# Patient Record
Sex: Female | Born: 1960 | Race: Black or African American | Hispanic: No | Marital: Married | Smoking: Former smoker
Health system: Southern US, Community
[De-identification: ages and names within clinical notes are randomized; demographics above are authoritative.]

## PROBLEM LIST (undated history)

## (undated) DIAGNOSIS — B009 Herpesviral infection, unspecified: Secondary | ICD-10-CM

## (undated) DIAGNOSIS — I10 Essential (primary) hypertension: Secondary | ICD-10-CM

## (undated) DIAGNOSIS — Z87898 Personal history of other specified conditions: Secondary | ICD-10-CM

## (undated) HISTORY — PX: NO PAST SURGERIES: SHX2092

## (undated) HISTORY — DX: Personal history of other specified conditions: Z87.898

---

## 1998-01-01 DIAGNOSIS — F1491 Cocaine use, unspecified, in remission: Secondary | ICD-10-CM

## 1998-01-01 DIAGNOSIS — Z87898 Personal history of other specified conditions: Secondary | ICD-10-CM

## 1998-01-01 HISTORY — DX: Personal history of other specified conditions: Z87.898

## 1998-01-01 HISTORY — DX: Cocaine use, unspecified, in remission: F14.91

## 1998-01-17 ENCOUNTER — Inpatient Hospital Stay (HOSPITAL_COMMUNITY): Admission: RE | Admit: 1998-01-17 | Discharge: 1998-01-17 | Payer: Self-pay | Admitting: Obstetrics & Gynecology

## 1998-01-19 ENCOUNTER — Inpatient Hospital Stay (HOSPITAL_COMMUNITY): Admission: AD | Admit: 1998-01-19 | Discharge: 1998-01-19 | Payer: Self-pay | Admitting: Obstetrics

## 1998-01-24 ENCOUNTER — Encounter (HOSPITAL_COMMUNITY): Admission: RE | Admit: 1998-01-24 | Discharge: 1998-02-16 | Payer: Self-pay | Admitting: Obstetrics & Gynecology

## 1998-01-27 ENCOUNTER — Inpatient Hospital Stay (HOSPITAL_COMMUNITY): Admission: AD | Admit: 1998-01-27 | Discharge: 1998-02-17 | Payer: Self-pay | Admitting: Obstetrics & Gynecology

## 1998-01-31 ENCOUNTER — Encounter: Payer: Self-pay | Admitting: Obstetrics & Gynecology

## 1998-02-14 ENCOUNTER — Encounter: Payer: Self-pay | Admitting: Obstetrics & Gynecology

## 1998-08-22 ENCOUNTER — Encounter: Admission: RE | Admit: 1998-08-22 | Discharge: 1998-08-22 | Payer: Self-pay | Admitting: Family Medicine

## 1998-09-06 ENCOUNTER — Encounter: Admission: RE | Admit: 1998-09-06 | Discharge: 1998-09-06 | Payer: Self-pay | Admitting: Family Medicine

## 1999-01-03 ENCOUNTER — Encounter: Admission: RE | Admit: 1999-01-03 | Discharge: 1999-01-03 | Payer: Self-pay | Admitting: Family Medicine

## 1999-02-06 ENCOUNTER — Encounter: Admission: RE | Admit: 1999-02-06 | Discharge: 1999-02-06 | Payer: Self-pay | Admitting: Family Medicine

## 1999-02-21 ENCOUNTER — Encounter: Admission: RE | Admit: 1999-02-21 | Discharge: 1999-02-21 | Payer: Self-pay | Admitting: Sports Medicine

## 1999-04-06 ENCOUNTER — Encounter: Admission: RE | Admit: 1999-04-06 | Discharge: 1999-04-06 | Payer: Self-pay | Admitting: Family Medicine

## 1999-08-22 ENCOUNTER — Other Ambulatory Visit: Admission: RE | Admit: 1999-08-22 | Discharge: 1999-08-22 | Payer: Self-pay | Admitting: Sports Medicine

## 1999-08-22 ENCOUNTER — Encounter: Admission: RE | Admit: 1999-08-22 | Discharge: 1999-08-22 | Payer: Self-pay | Admitting: Sports Medicine

## 2000-01-11 ENCOUNTER — Encounter: Admission: RE | Admit: 2000-01-11 | Discharge: 2000-01-11 | Payer: Self-pay | Admitting: Family Medicine

## 2000-02-21 ENCOUNTER — Emergency Department (HOSPITAL_COMMUNITY): Admission: EM | Admit: 2000-02-21 | Discharge: 2000-02-21 | Payer: Self-pay | Admitting: Emergency Medicine

## 2000-03-26 ENCOUNTER — Encounter: Admission: RE | Admit: 2000-03-26 | Discharge: 2000-03-26 | Payer: Self-pay | Admitting: Sports Medicine

## 2000-03-28 ENCOUNTER — Encounter: Admission: RE | Admit: 2000-03-28 | Discharge: 2000-03-28 | Payer: Self-pay | Admitting: Family Medicine

## 2000-04-02 ENCOUNTER — Encounter: Admission: RE | Admit: 2000-04-02 | Discharge: 2000-04-02 | Payer: Self-pay | Admitting: Family Medicine

## 2000-04-26 ENCOUNTER — Encounter: Admission: RE | Admit: 2000-04-26 | Discharge: 2000-04-26 | Payer: Self-pay | Admitting: Family Medicine

## 2000-05-21 ENCOUNTER — Encounter: Admission: RE | Admit: 2000-05-21 | Discharge: 2000-05-21 | Payer: Self-pay | Admitting: Family Medicine

## 2000-09-11 ENCOUNTER — Encounter: Admission: RE | Admit: 2000-09-11 | Discharge: 2000-09-11 | Payer: Self-pay | Admitting: Family Medicine

## 2000-09-11 ENCOUNTER — Other Ambulatory Visit: Admission: RE | Admit: 2000-09-11 | Discharge: 2000-09-11 | Payer: Self-pay | Admitting: Obstetrics

## 2000-11-15 ENCOUNTER — Emergency Department (HOSPITAL_COMMUNITY): Admission: EM | Admit: 2000-11-15 | Discharge: 2000-11-15 | Payer: Self-pay | Admitting: Emergency Medicine

## 2001-09-03 ENCOUNTER — Encounter: Admission: RE | Admit: 2001-09-03 | Discharge: 2001-09-03 | Payer: Self-pay | Admitting: Family Medicine

## 2001-09-24 ENCOUNTER — Encounter: Admission: RE | Admit: 2001-09-24 | Discharge: 2001-09-24 | Payer: Self-pay | Admitting: Family Medicine

## 2001-09-29 ENCOUNTER — Encounter: Admission: RE | Admit: 2001-09-29 | Discharge: 2001-09-29 | Payer: Self-pay | Admitting: Family Medicine

## 2001-10-07 ENCOUNTER — Encounter: Admission: RE | Admit: 2001-10-07 | Discharge: 2001-10-07 | Payer: Self-pay | Admitting: Sports Medicine

## 2001-11-03 ENCOUNTER — Encounter: Admission: RE | Admit: 2001-11-03 | Discharge: 2001-11-03 | Payer: Self-pay | Admitting: Sports Medicine

## 2002-09-22 ENCOUNTER — Emergency Department (HOSPITAL_COMMUNITY): Admission: EM | Admit: 2002-09-22 | Discharge: 2002-09-22 | Payer: Self-pay | Admitting: Emergency Medicine

## 2002-09-28 ENCOUNTER — Encounter: Admission: RE | Admit: 2002-09-28 | Discharge: 2002-09-28 | Payer: Self-pay | Admitting: Family Medicine

## 2002-10-21 ENCOUNTER — Encounter: Admission: RE | Admit: 2002-10-21 | Discharge: 2002-10-21 | Payer: Self-pay | Admitting: Family Medicine

## 2002-10-21 ENCOUNTER — Encounter (INDEPENDENT_AMBULATORY_CARE_PROVIDER_SITE_OTHER): Payer: Self-pay | Admitting: *Deleted

## 2002-11-30 ENCOUNTER — Encounter: Admission: RE | Admit: 2002-11-30 | Discharge: 2002-11-30 | Payer: Self-pay | Admitting: Sports Medicine

## 2003-02-23 ENCOUNTER — Encounter: Admission: RE | Admit: 2003-02-23 | Discharge: 2003-02-23 | Payer: Self-pay | Admitting: Family Medicine

## 2004-01-05 ENCOUNTER — Ambulatory Visit: Payer: Self-pay | Admitting: Family Medicine

## 2004-01-24 ENCOUNTER — Encounter: Admission: RE | Admit: 2004-01-24 | Discharge: 2004-01-24 | Payer: Self-pay | Admitting: Sports Medicine

## 2005-01-01 ENCOUNTER — Encounter (INDEPENDENT_AMBULATORY_CARE_PROVIDER_SITE_OTHER): Payer: Self-pay | Admitting: *Deleted

## 2005-01-10 ENCOUNTER — Ambulatory Visit: Payer: Self-pay | Admitting: Family Medicine

## 2005-01-30 ENCOUNTER — Encounter: Admission: RE | Admit: 2005-01-30 | Discharge: 2005-01-30 | Payer: Self-pay | Admitting: Sports Medicine

## 2005-08-27 ENCOUNTER — Ambulatory Visit: Payer: Self-pay | Admitting: Family Medicine

## 2005-09-13 ENCOUNTER — Ambulatory Visit: Payer: Self-pay | Admitting: Family Medicine

## 2005-10-11 ENCOUNTER — Ambulatory Visit: Payer: Self-pay | Admitting: Family Medicine

## 2005-10-29 ENCOUNTER — Ambulatory Visit: Payer: Self-pay | Admitting: Family Medicine

## 2005-11-28 ENCOUNTER — Ambulatory Visit: Payer: Self-pay | Admitting: Family Medicine

## 2005-12-28 ENCOUNTER — Ambulatory Visit: Payer: Self-pay | Admitting: Family Medicine

## 2006-01-08 ENCOUNTER — Encounter (INDEPENDENT_AMBULATORY_CARE_PROVIDER_SITE_OTHER): Payer: Self-pay | Admitting: Family Medicine

## 2006-01-08 ENCOUNTER — Ambulatory Visit: Payer: Self-pay | Admitting: Family Medicine

## 2006-01-08 LAB — CONVERTED CEMR LAB
CO2: 22 meq/L (ref 19–32)
Calcium: 9.1 mg/dL (ref 8.4–10.5)
Potassium: 4.1 meq/L (ref 3.5–5.3)
Sodium: 141 meq/L (ref 135–145)

## 2006-02-27 ENCOUNTER — Encounter: Admission: RE | Admit: 2006-02-27 | Discharge: 2006-02-27 | Payer: Self-pay | Admitting: Vascular Surgery

## 2006-02-28 DIAGNOSIS — I10 Essential (primary) hypertension: Secondary | ICD-10-CM | POA: Insufficient documentation

## 2006-02-28 DIAGNOSIS — G4700 Insomnia, unspecified: Secondary | ICD-10-CM | POA: Insufficient documentation

## 2006-03-01 ENCOUNTER — Encounter (INDEPENDENT_AMBULATORY_CARE_PROVIDER_SITE_OTHER): Payer: Self-pay | Admitting: *Deleted

## 2006-03-20 ENCOUNTER — Ambulatory Visit: Payer: Self-pay | Admitting: Sports Medicine

## 2007-02-24 ENCOUNTER — Telehealth (INDEPENDENT_AMBULATORY_CARE_PROVIDER_SITE_OTHER): Payer: Self-pay | Admitting: Family Medicine

## 2007-02-24 ENCOUNTER — Telehealth: Payer: Self-pay | Admitting: *Deleted

## 2007-02-25 ENCOUNTER — Ambulatory Visit: Payer: Self-pay | Admitting: Family Medicine

## 2007-03-04 ENCOUNTER — Encounter (INDEPENDENT_AMBULATORY_CARE_PROVIDER_SITE_OTHER): Payer: Self-pay | Admitting: Family Medicine

## 2007-03-04 ENCOUNTER — Ambulatory Visit: Payer: Self-pay | Admitting: Family Medicine

## 2007-03-04 LAB — CONVERTED CEMR LAB: Whiff Test: NEGATIVE

## 2007-03-09 ENCOUNTER — Encounter (INDEPENDENT_AMBULATORY_CARE_PROVIDER_SITE_OTHER): Payer: Self-pay | Admitting: *Deleted

## 2007-03-10 ENCOUNTER — Ambulatory Visit: Payer: Self-pay | Admitting: Sports Medicine

## 2007-03-10 ENCOUNTER — Encounter (INDEPENDENT_AMBULATORY_CARE_PROVIDER_SITE_OTHER): Payer: Self-pay | Admitting: Family Medicine

## 2007-03-12 LAB — CONVERTED CEMR LAB
Cholesterol: 146 mg/dL (ref 0–200)
HDL: 60 mg/dL (ref >39)

## 2007-09-10 ENCOUNTER — Ambulatory Visit: Payer: Self-pay | Admitting: Family Medicine

## 2007-09-10 LAB — CONVERTED CEMR LAB: Hemoglobin: 12.9 g/dL

## 2007-10-22 ENCOUNTER — Encounter: Admission: RE | Admit: 2007-10-22 | Discharge: 2007-10-22 | Payer: Self-pay | Admitting: Family Medicine

## 2009-08-08 ENCOUNTER — Ambulatory Visit: Payer: Self-pay | Admitting: Family Medicine

## 2009-08-08 ENCOUNTER — Encounter: Payer: Self-pay | Admitting: Family Medicine

## 2009-08-08 LAB — CONVERTED CEMR LAB
BUN: 11 mg/dL (ref 6–23)
CO2: 25 meq/L (ref 19–32)
Calcium: 9.2 mg/dL (ref 8.4–10.5)
Creatinine, Ser: 0.78 mg/dL (ref 0.40–1.20)
Glucose, Bld: 98 mg/dL (ref 70–99)

## 2009-08-22 ENCOUNTER — Ambulatory Visit: Payer: Self-pay | Admitting: Family Medicine

## 2009-09-15 ENCOUNTER — Ambulatory Visit: Payer: Self-pay | Admitting: Family Medicine

## 2009-10-06 ENCOUNTER — Ambulatory Visit: Payer: Self-pay | Admitting: Family Medicine

## 2009-10-06 ENCOUNTER — Encounter: Payer: Self-pay | Admitting: Family Medicine

## 2009-10-06 DIAGNOSIS — M79609 Pain in unspecified limb: Secondary | ICD-10-CM | POA: Insufficient documentation

## 2009-10-06 DIAGNOSIS — E669 Obesity, unspecified: Secondary | ICD-10-CM | POA: Insufficient documentation

## 2009-10-06 LAB — CONVERTED CEMR LAB
ALT: 12 units/L (ref 0–35)
Albumin: 4.2 g/dL (ref 3.5–5.2)
CO2: 27 meq/L (ref 19–32)
Chlamydia, DNA Probe: NEGATIVE
Chloride: 102 meq/L (ref 96–112)
Cholesterol: 181 mg/dL (ref 0–200)
GC Probe Amp, Genital: NEGATIVE
HDL: 68 mg/dL (ref >39)
Potassium: 3.6 meq/L (ref 3.5–5.3)
Sodium: 139 meq/L (ref 135–145)
Total Bilirubin: 0.6 mg/dL (ref 0.3–1.2)
Total Protein: 7 g/dL (ref 6.0–8.3)

## 2009-12-25 ENCOUNTER — Emergency Department (HOSPITAL_COMMUNITY)
Admission: EM | Admit: 2009-12-25 | Discharge: 2009-12-25 | Payer: Self-pay | Source: Home / Self Care | Admitting: Emergency Medicine

## 2010-01-22 ENCOUNTER — Encounter: Payer: Self-pay | Admitting: Family Medicine

## 2010-01-24 ENCOUNTER — Encounter
Admission: RE | Admit: 2010-01-24 | Discharge: 2010-01-24 | Payer: Self-pay | Source: Home / Self Care | Attending: Family Medicine | Admitting: Family Medicine

## 2010-01-31 NOTE — Assessment & Plan Note (Signed)
Summary: bp check/eo  Nurse Visit  Patient was in office early this AM for BP check. BP checked after patient rested .BP checked manually with regular adult cuff. She  is taking medications as directed. BP bilaterally 140/92. Pulse 64. Weight 191.3 today. States she is exercising as directed. Will forward message to Dr. Janalyn Harder since she was seen by Dr. Janalyn Harder on 08/08/2009.  Marland Kitchen Appointment scheduled with PCP  09/15/2009. Theresia Lo RN  August 22, 2009 4:21 PM   Orders Added: 1)  No Charge Patient Arrived (NCPA0) [NCPA0]  I will not make changes to med at this time.  Pt has been noncompliant.  I will see pt in clinic and decide at that time what should be done.  If BP continues in this thrend, will most likely add ACEi. Cat Ta MD  August 22, 2009 5:56 PM

## 2010-01-31 NOTE — Assessment & Plan Note (Signed)
Summary: F/U MEDS/NEED REFILLS/[facility]   Vital Signs:  Patient profile:   50 year old female Height:      67 inches Weight:      190.5 pounds BMI:     29.94 Temp:     98.3 degrees F Pulse rate:   71 / minute BP sitting:   158 / 88  Vitals Entered By: Golden Circle RN (August 08, 2009 8:33 AM)  Primary Care Provider:  Ellin Mayhew MD  CC:  Med refills.  History of Present Illness: 50 y/o F here for Med refills.   HYPERTENSION Disease Monitoring Blood pressure range: states she does not check her BP until she feels like she has HA, last check 178/70 about a month.  Last refill was on 03/30/09 with 2 refills.  Per pt she takes her meds everyday and ONLY misses on the weekends.  Then admits that she missed now and then.  She has been out of med since last Sunday, 8 days ago.  States that now she will keep her med in her car and takes it in AM on way to work.   Medications: HCTZ 25mg  daily Compliance: NO  Lightheadedness: sometimes, started on Saturday, took Tylenol and was relief Edema:  no  Chest pain: no   Dyspnea: no Prevention Exercise: no  Salt restriction: yes Water consumption: 40 oz water + 40 oz herbal tea   Habits & Providers  Alcohol-Tobacco-Diet     Alcohol drinks/day: 0     Tobacco Status: quit  Exercise-Depression-Behavior     Seat Belt Use: always     Sun Exposure: rarely  Past History:  Past Medical History: -H/o preeclampsia 2000 -Hx cocaine use, smoke (quit 10 yrs ago) -HYPERTENSION, BENIGN SYSTEMIC (ICD-401.1)  **Medical non-compliance  Social History: Sun Exposure-Excessive:  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   Neck:  No bruit Lungs:  Normal respiratory effort, chest expands symmetrically. Lungs are clear to  auscultation, no crackles or wheezes. Heart:  Normal rate and regular rhythm. S1 and S2 normal without gallop, murmur, click, rub or other extra sounds. Pulses:  R radial normal, R dorsalis pedis normal, L radial normal, and L dorsalis pedis normal.   Extremities:  No clubbing, cyanosis, edema, or deformity noted with normal full range of motion of all joints.   Neurologic:  alert & oriented X3 and cranial nerves II-XII intact.     Impression & Recommendations:  Problem # 1:  HYPERTENSION, BENIGN SYSTEMIC (ICD-401.1) Assessment Deteriorated Pt noncompliant.  Takes med whenever she remembers.  She states that she takes med daily until last Sunday.  When confronted with the impossibility of this due to med refills, pt admits that she misses dose.  Will resume previous hctz 25 for now.  Will check bmet as last check was in 2009.  Pt to rtc in 2 wks to see nurse for BP check.  Pt to rtc in 4-5 wks for BP f/u. We spent a lot of time discussing the importance of taking her meds and to have f/u visits to discuss med changes.  Pt does not have insurance so that may be barrier to getting her here for visit.  Told her about [facility].    Her updated medication list for this problem includes:  Hydrochlorothiazide 25 Mg Tabs (Hydrochlorothiazide) .Marland Kitchen... 1 tablet by mouth once a day  Orders: T-Basic Metabolic Panel [id number]) [facility]- Est Level  3 (24401)  Problem # 2:  Screening Cervical Cancer (ICD-V76.2) Assessment: Comment Only Discussed that she is due.   Problem # 3:  Screening Breast Cancer (ICD-V76.10) Assessment: Comment Only Gave pt order to get MMG.    Complete Medication List: 1)  Hydrochlorothiazide 25 Mg Tabs (Hydrochlorothiazide) .Marland Kitchen.. 1 tablet by mouth once a day 2)  Sumatriptan Succinate 25 Mg Tabs (Sumatriptan succinate) .Marland Kitchen.. 1 tab by mouth for migraine.  may repeat in 2 hours  Other Orders: Mammogram (Screening) (Mammo)  Patient Instructions: 1)  Please schedule a follow-up  appointment in 1 month with your primary doctor for blood pressure.  2)  Please schedule a follow-up appointment in 2 weeks for Nurse visit to check blood pressure.  3)  It is important that you exercise reguarly at least 20 minutes 5 times a week. If you develop chest pain, have severe difficulty breathing, or feel very tired, stop exercising immediately and seek medical attention.  4)  You need to lose weight. Consider a lower calorie diet and regular exercise.  5)  Schedule your mammogram.  Prescriptions: HYDROCHLOROTHIAZIDE 25 MG TABS (HYDROCHLOROTHIAZIDE) 1 tablet by mouth once a day  #31 x 0   Entered and Authorized by:   Angeline Slim MD   Signed by:   Angeline Slim MD on 08/08/2009   Method used:   Electronically to        [facility]* (retail)       [address]       [place], [place]  [id number]       Ph: [phone]       Fax: [phone]   RxID:   [id number] HYDROCHLOROTHIAZIDE 25 MG TABS (HYDROCHLOROTHIAZIDE) 1 tablet by mouth once a day  #31 x 0   Entered and Authorized by:   Angeline Slim MD   Signed by:   Angeline Slim MD on 08/08/2009   Method used:   [facility] [address] [phone]* (retail)       [address]       [place], [place]  [address]       Ph: [phone] or [phone]       Fax: [phone]   RxID:   [id number]    Prevention & Chronic Care Immunizations   Influenza vaccine: declined  (03/04/2007)   Influenza vaccine due: 03/03/2008    Tetanus booster: 08/01/2005: Done.   Tetanus booster due: 08/02/2015    Pneumococcal vaccine: Not documented  Other Screening   Pap smear: normal  (03/04/2007)   Pap smear due: 03/2009    Mammogram: normal  (10/22/2007)   Mammogram action/deferral: Ordered  (08/08/2009)   Mammogram due: 10/21/2008   Smoking status: quit  (08/08/2009)  Lipids   Total Cholesterol: 146  (03/10/2007)   LDL: 78  (03/10/2007)   LDL Direct: Not documented   HDL: 60  (03/10/2007)    Triglycerides: 40  (03/10/2007)  Hypertension   Last Blood Pressure: 158 / 88  (08/08/2009)   Serum creatinine: 0.85  (01/08/2006)   BMP action: Ordered   Serum potassium 4.1  (01/08/2006)    Hypertension flowsheet reviewed?: Yes   Progress toward BP goal: Deteriorated  Self-Management Support :   Personal Goals (by the next clinic visit) :  Personal blood pressure goal: 130/80  (08/08/2009)   Hypertension self-management support: Not documented   Nursing Instructions: Schedule screening mammogram (see order)

## 2010-01-31 NOTE — Letter (Signed)
Summary: Work Excuse  [facility]  [address]   [place], [place] [address]   Phone: [phone]  Fax: [phone]    Today's Date: August 08, 2009  Name of Patient: Wendy Mann [facility]  The above named patient had a medical visit today at:  8:45 am   Please take this into consideration when reviewing the time away from work/school.    Special Instructions:  [ x ] None  [  ] To be off the remainder of today, returning to the normal work / school schedule tomorrow.  [  ] To be off until the next scheduled appointment on ______________________.  [  ] Other ________________________________________________________________ ________________________________________________________________________   Sincerely yours,   Cat Ta MD

## 2010-01-31 NOTE — Assessment & Plan Note (Signed)
Summary: CPE   Vital Signs:  Patient profile:   50 year old female Height:      66 inches Weight:      188.44 pounds BMI:     30.52 BSA:     1.95 Temp:     98.3 degrees F Pulse rate:   66 / minute BP sitting:   130 / 84  Vitals Entered By: Jone Baseman CMA (October 06, 2009 8:42 AM) CC: f/u Is Patient Diabetic? No Pain Assessment Patient in pain? no        Primary Care Provider:  Ellin Mayhew MD  CC:  f/u.  History of Present Illness: left foot pain: medial left heel pain, x 5 months, off and on, worse with activity, in am 5/10, after work pain is 10/10.  pain is throbbing, sitting down makes it better.  tylenol helps minimally.  has caused her to decrease her exercise.  weight: pt states her goal weight is 175 lbs.  less exercise b/c of heel pain.  Still exercising 2 x week for 45 min.  Good diet--approx 4 servings of vegtables.      Habits & Providers  Alcohol-Tobacco-Diet     Alcohol drinks/day: 0     Tobacco Status: quit  Current Medications (verified): 1)  Hydrochlorothiazide 25 Mg Tabs (Hydrochlorothiazide) .Marland Kitchen.. 1 Tablet By Mouth Once A Day 2)  Sumatriptan Succinate 25 Mg Tabs (Sumatriptan Succinate) .Marland Kitchen.. 1 Tab By Mouth For Migraine.  May Repeat in 2 Hours  Allergies (verified): No Known Drug Allergies  Review of Systems       No weight loss, no fever, no urinary or bowel problems, no blood in stool, no dark stools  Physical Exam  General:  VSS Well-developed,well-nourished,in no acute distress; alert,appropriate and cooperative throughout examination Lungs:  Normal respiratory effort, chest expands symmetrically. Lungs are clear to auscultation, no crackles or wheezes. Heart:  Normal rate and regular rhythm. S1 and S2 normal without gallop, murmur, click, rub or other extra sounds. Genitalia:  Pelvic Exam:        External: normal female genitalia without lesions or masses        Vagina: normal without lesions or masses        Cervix: normal  without lesions or masses        Adnexa: normal bimanual exam without masses or fullness        Uterus: normal by palpation        Pap smear: performed Msk:  on left medial heel small callous present.  point tenderness at site of callous Pulses:  2+ Extremities:  no edema Neurologic:  No cranial nerve deficits noted. Station and gait are normal. Plantar reflexes are down-going bilaterally. DTRs are symmetrical throughout. Sensory, motor and coordinative functions appear intact. Skin:  Intact without suspicious lesions or rashes Psych:  Cognition and judgment appear intact. Alert and cooperative with normal attention span and concentration. No apparent delusions, illusions, hallucinations   Impression & Recommendations:  Problem # 1:  HYPERTENSION, BENIGN SYSTEMIC (ICD-401.1) well controlled today.  will continue same regimen.  Pt to return in 6 months for follow up appt.  Her updated medication list for this problem includes:    Hydrochlorothiazide 25 Mg Tabs (Hydrochlorothiazide) .Marland Kitchen... 1 tablet by mouth once a day  Orders: Lipid-[facility] ([id number]) Comp Met-[facility] ([id number]) [facility] - Est  40-64 yrs ([address])  Problem # 2:  SEXUALLY TRANSMITTED DISEASE, EXPOSURE TO (ICD-V01.6) Pt requests that she be retested for HIV since she has a  hx of drug abuse.  Pt states she has no recent known exposure.   Orders: HIV-[facility] ([id number]) RPR-[facility] [id number]) GC/Chlamydia-[facility] (87591/[id number]) [facility] - Est  40-64 yrs ([address]42)  Problem # 3:  Preventive Health Care (ICD-V70.0) Will obtain cholesterol screen and BMET in the setting of overweight and hypertension. Enouraged pt to schedule mammogram.  Orders: [facility] - Est  40-64 yrs ([address])  Problem # 4:  FOOT PAIN, LEFT (ICD-729.5) pain located  at callous.  Most likely 2/2 friction from work shoes.  Pt is going to switch out work shoes to see if this pain improves.  Pt to return if no improvement.   Problem # 5:  OBESITY, UNSPECIFIED (ICD-278.00) Pt is  working out at [employer] 2 x week.  Pt plans to increase frequency of visits to gym.  Pt states that she has a healthy diet.  she will consider meeting with nutritionist for guidance on weight loss.  She will call for an appt if she decides she would like to do this.   Complete Medication List: 1)  Hydrochlorothiazide 25 Mg Tabs (Hydrochlorothiazide) .Marland Kitchen.. 1 tablet by mouth once a day 2)  Sumatriptan Succinate 25 Mg Tabs (Sumatriptan succinate) .Marland Kitchen.. 1 tab by mouth for migraine.  may repeat in 2 hours  Other Orders: Pap Smear-[facility] ([id number])  Patient Instructions: 1)  Go speak with [place] about finanicial assistance. 2)  Schedule mammogram- you are due for this.  3)  Consider meeting with nutritionist to discuss weight loss and nutrition. 4)  continue to work out- try to increase frequency. 5)  return in 6 months for blood pressure recheck or sooner if needed.   Prevention & Chronic Care Immunizations   Influenza vaccine: declined  (03/04/2007)   Influenza vaccine deferral: Refused  (10/06/2009)   Influenza vaccine due: 03/03/2008    Tetanus booster: 08/01/2005: Done.   Tetanus booster due: 08/02/2015    Pneumococcal vaccine: Not documented  Other Screening   Pap smear: normal  (03/04/2007)   Pap smear action/deferral: Ordered  (10/06/2009)   Pap smear due: 03/2009    Mammogram: normal  (10/22/2007)   Mammogram action/deferral: Ordered  (08/08/2009)   Mammogram due: 10/21/2008   Smoking status: quit  (10/06/2009)  Lipids   Total Cholesterol: 146  (03/10/2007)   LDL: 78  (03/10/2007)   LDL Direct: Not documented   HDL: 60  (03/10/2007)   Triglycerides: 40  (03/10/2007)  Hypertension   Last Blood Pressure: 130 / 84  (10/06/2009)   Serum creatinine: 0.78  (08/08/2009)   BMP action: Ordered   Serum potassium 4.3  (08/08/2009) CMP ordered     Hypertension flowsheet reviewed?: Yes   Progress toward BP goal: At goal  Self-Management Support :   Personal Goals  (by the next clinic visit) :      Personal blood pressure goal: 130/80  (08/08/2009)   Hypertension self-management support: Not documented   Nursing Instructions: Pap smear today

## 2010-01-31 NOTE — Assessment & Plan Note (Signed)
Summary: BP follow up   Vital Signs:  Patient profile:   50 year old female Height:      67 inches Weight:      187 pounds BMI:     29.39 Temp:     98.1 degrees F oral Pulse rate:   65 / minute BP sitting:   137 / 86  (right arm) Cuff size:   regular  Vitals Entered By: Tessie Fass CMA (September 15, 2009 8:50 AM) CC: F/U BP, Hypertension Management Is Patient Diabetic? No Pain Assessment Patient in pain? no        Primary Care Provider:  Ellin Mayhew MD  CC:  F/U BP and Hypertension Management.  History of Present Illness: Pt is here for bp recheck.  She states she has been taking bp medication consistently and that she has started going to the gym 3 x week for exercise.  Pt BP 137/86.  HR 65.  Hypertension History:      Positive major cardiovascular risk factors include hypertension.  Negative major cardiovascular risk factors include female age less than 15 years old and non-tobacco-user status.    Habits & Providers  Alcohol-Tobacco-Diet     Tobacco Status: quit  Current Medications (verified): 1)  Hydrochlorothiazide 25 Mg Tabs (Hydrochlorothiazide) .Marland Kitchen.. 1 Tablet By Mouth Once A Day 2)  Sumatriptan Succinate 25 Mg Tabs (Sumatriptan Succinate) .Marland Kitchen.. 1 Tab By Mouth For Migraine.  May Repeat in 2 Hours  Allergies (verified): No Known Drug Allergies  Review of Systems       No CP, No SOB, No edema  Physical Exam  General:  VSS Well-developed,well-nourished,in no acute distress; alert,appropriate and cooperative throughout examination Lungs:  Normal respiratory effort, chest expands symmetrically. Lungs are clear to auscultation, no crackles or wheezes. Heart:  Normal rate and regular rhythm. S1 and S2 normal without gallop, murmur, click, rub or other extra sounds. Extremities:  No edema Psych:  Cognition and judgment appear intact. Alert and cooperative with normal attention span and concentration. No apparent delusions, illusions,  hallucinations   Impression & Recommendations:  Problem # 1:  HYPERTENSION, BENIGN SYSTEMIC (ICD-401.1) Pt BP 137/86 today.  This is WNL.  Pt BP goal is less than 140/90.  Encouraged to continue to exercise and take medication as directed.   Her updated medication list for this problem includes:    Hydrochlorothiazide 25 Mg Tabs (Hydrochlorothiazide) .Marland Kitchen... 1 tablet by mouth once a day  Orders: [facility]- Est Level  3 ([address])  Problem # 2:  Preventive Health Care (ICD-V70.0) Pt to make an appt within the next month or so for Complete physical exam.  At that time we will review the PCMH form together to make sure all screening exams are up to date.  Will also talk about weight management at that visit.   Complete Medication List: 1)  Hydrochlorothiazide 25 Mg Tabs (Hydrochlorothiazide) .Marland Kitchen.. 1 tablet by mouth once a day 2)  Sumatriptan Succinate 25 Mg Tabs (Sumatriptan succinate) .Marland Kitchen.. 1 tab by mouth for migraine.  may repeat in 2 hours  Hypertension Assessment/Plan:      The patient's hypertensive risk group is category A: No risk factors and no target organ damage.  Her calculated 10 year risk of coronary heart disease is 2 %.  Today's blood pressure is 137/86.  Her blood pressure goal is < 140/90.   Patient Instructions: 1)  return within 1 month for a complete physical.  2)  You are doing great!!!  Keep up the good  work. Prescriptions: HYDROCHLOROTHIAZIDE 25 MG TABS (HYDROCHLOROTHIAZIDE) 1 tablet by mouth once a day  #30 x 6   Entered and Authorized by:   Ellin Mayhew MD   Signed by:   Ellin Mayhew MD on 09/15/2009   Method used:   Electronically to        [facility]* (retail)       [address]       [place], [place]  [id number]       Ph: [phone]       Fax: [phone]   RxID:   [id number] HYDROCHLOROTHIAZIDE 25 MG TABS (HYDROCHLOROTHIAZIDE) 1 tablet by mouth once a day  #30 x 6   Entered and Authorized by:   Ellin Mayhew MD   Signed by:   Ellin Mayhew MD on 09/15/2009   Method used:   [facility] [address] [phone][facility] (retail)       [address]       [place], [place]  [address]       Ph: [phone] or [phone]       Fax: [phone]   RxID:   [id number]    Prevention & Chronic Care Immunizations   Influenza vaccine: declined  (03/04/2007)   Influenza vaccine due: 03/03/2008    Tetanus booster: 08/01/2005: Done.   Tetanus booster due: 08/02/2015    Pneumococcal vaccine: Not documented  Other Screening   Pap smear: normal  (03/04/2007)   Pap smear due: 03/2009    Mammogram: normal  (10/22/2007)   Mammogram action/deferral: Ordered  (08/08/2009)   Mammogram due: 10/21/2008   Smoking status: quit  (09/15/2009)  Lipids   Total Cholesterol: 146  (03/10/2007)   LDL: 78  (03/10/2007)   LDL Direct: Not documented   HDL: 60  (03/10/2007)   Triglycerides: 40  (03/10/2007)  Hypertension   Last Blood Pressure: 137 / 86  (09/15/2009)   Serum creatinine: 0.78  (08/08/2009)   BMP action: Ordered   Serum potassium 4.3  (08/08/2009)    Hypertension flowsheet reviewed?: Yes   Progress toward BP goal: At goal  Self-Management Support :   Personal Goals (by the next clinic visit) :      Personal blood pressure goal: 130/80  (08/08/2009)   Hypertension self-management support: Not documented

## 2010-02-07 ENCOUNTER — Encounter: Payer: Self-pay | Admitting: *Deleted

## 2010-03-23 ENCOUNTER — Ambulatory Visit (INDEPENDENT_AMBULATORY_CARE_PROVIDER_SITE_OTHER): Payer: Managed Care, Other (non HMO) | Admitting: *Deleted

## 2010-03-23 VITALS — Temp 98.3°F

## 2010-03-23 DIAGNOSIS — Z23 Encounter for immunization: Secondary | ICD-10-CM

## 2010-03-23 DIAGNOSIS — Z139 Encounter for screening, unspecified: Secondary | ICD-10-CM

## 2010-03-23 MED ORDER — HEPATITIS A VACCINE 1440 EL U/ML IM SUSP: 1.0000 mL | Freq: Once | INTRAMUSCULAR | Status: DC

## 2010-03-23 MED ORDER — TETANUS-DIPHTH-ACELL PERTUSSIS 5-2.5-18.5 LF-MCG/0.5 IM SUSP: 0.5000 mL | Freq: Once | INTRAMUSCULAR | Status: DC

## 2010-03-23 NOTE — Progress Notes (Signed)
patient is requesting MMR titer to see if she is immune to MMR.  Will do at next visit if still interested.  Request immunizations because she is going to [place].

## 2010-04-06 ENCOUNTER — Other Ambulatory Visit: Payer: Self-pay | Admitting: Family Medicine

## 2010-04-06 MED ORDER — HYDROCHLOROTHIAZIDE 25 MG PO TABS: 25.0000 mg | ORAL_TABLET | Freq: Every day | ORAL | Status: DC

## 2010-04-14 ENCOUNTER — Ambulatory Visit: Payer: Managed Care, Other (non HMO) | Admitting: Family Medicine

## 2010-04-24 ENCOUNTER — Ambulatory Visit (INDEPENDENT_AMBULATORY_CARE_PROVIDER_SITE_OTHER): Payer: Managed Care, Other (non HMO) | Admitting: *Deleted

## 2010-04-24 ENCOUNTER — Other Ambulatory Visit: Payer: Managed Care, Other (non HMO)

## 2010-04-24 VITALS — Temp 98.3°F

## 2010-04-24 DIAGNOSIS — Z139 Encounter for screening, unspecified: Secondary | ICD-10-CM

## 2010-04-24 DIAGNOSIS — Z23 Encounter for immunization: Secondary | ICD-10-CM

## 2011-10-02 ENCOUNTER — Encounter: Payer: Self-pay | Admitting: Family Medicine

## 2011-10-02 ENCOUNTER — Ambulatory Visit (INDEPENDENT_AMBULATORY_CARE_PROVIDER_SITE_OTHER): Payer: Self-pay | Admitting: Family Medicine

## 2011-10-02 VITALS — BP 132/80 | HR 64 | Ht 66.0 in | Wt 183.5 lb

## 2011-10-02 DIAGNOSIS — I10 Essential (primary) hypertension: Secondary | ICD-10-CM

## 2011-10-02 MED ORDER — HYDROCHLOROTHIAZIDE 25 MG PO TABS: 25.0000 mg | ORAL_TABLET | Freq: Every day | ORAL | Status: DC

## 2011-10-02 NOTE — Progress Notes (Signed)
  Subjective:    Patient ID: Wendy Mann, female    DOB: [birth year], 51 y.o.   MRN: [id number]  HPI Seen here for BP follow up. Out of meds for several days.  No chest pain or dyspnea.  LMP about 1 yr ago.   Nonsmoker for about 16 yrs.    No diagnoses of CAD or DM.  LAst lipid panel from 2011 reviewed with patient; LDL at that time was 101 and HDL 68.    Mild HA today; she says it is mild and generalized, no associated symptoms that would implicate migraine.  She believes it is related to being out of HCTZ.  Offered flu shot; she is concerned about getting the flu from the shot.  We discussed why this is not possible, but side effects are possible.  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   Applying for [employer] or [employer]; works at [employer].    Review of Systems     Objective:   Physical Exam Well appearing, no apparent distress HEENT Neck supple, no adenopathy COR Regular S1S2 PULM Clear bilaterally.  EXTS no ankle edema; palpable dp pulses bilaterally.        Assessment & Plan:

## 2011-10-02 NOTE — Patient Instructions (Addendum)
It was a pleasure to meet you today.  I am ordering your labs for 'future orders' as a fasting test (metabolic panel, lipids/cholesterol)  [facility].

## 2011-10-02 NOTE — Assessment & Plan Note (Signed)
Patient here for follow up of HTN; has been out of her HCTZ for several days.  Feels mild aching head discomfort which she ascribes to being out of medicine.  No nausea or photophobia.  Still working at [employer].   Applying for [employer] or [employer].  To come for fasting labs once this is sorted out.  Med refill done today for 1 yr. Her manual cuff reading was below cutoff, even when out of medicine.

## 2012-01-09 ENCOUNTER — Encounter (HOSPITAL_COMMUNITY): Payer: Self-pay | Admitting: Emergency Medicine

## 2012-01-09 ENCOUNTER — Emergency Department (INDEPENDENT_AMBULATORY_CARE_PROVIDER_SITE_OTHER)
Admission: EM | Admit: 2012-01-09 | Discharge: 2012-01-09 | Disposition: A | Discharge status: Home / Self Care | Payer: Self-pay | Source: Home / Self Care | Attending: Family Medicine | Admitting: Family Medicine

## 2012-01-09 ENCOUNTER — Emergency Department (INDEPENDENT_AMBULATORY_CARE_PROVIDER_SITE_OTHER): Payer: Self-pay

## 2012-01-09 DIAGNOSIS — S93409A Sprain of unspecified ligament of unspecified ankle, initial encounter: Secondary | ICD-10-CM

## 2012-01-09 DIAGNOSIS — S93402A Sprain of unspecified ligament of left ankle, initial encounter: Secondary | ICD-10-CM

## 2012-01-09 HISTORY — DX: Essential (primary) hypertension: I10

## 2012-01-09 MED ORDER — IBUPROFEN 800 MG PO TABS: 800.0000 mg | ORAL_TABLET | Freq: Three times a day (TID) | ORAL | Status: DC

## 2012-01-09 MED ORDER — HYDROCODONE-ACETAMINOPHEN 5-325 MG PO TABS: 1.0000 | ORAL_TABLET | Freq: Four times a day (QID) | ORAL | Status: DC | PRN

## 2012-01-09 NOTE — ED Provider Notes (Signed)
History     CSN: [id number]  Arrival date & time 01/09/12  1631   First MD Initiated Contact with Patient 01/09/12 1648      Chief Complaint  Patient presents with  . Ankle Pain    left ankle injury at 11:50 a.m. pt twisted ankle stepping off curve    (Consider location/radiation/quality/duration/timing/severity/associated sxs/prior treatment) Patient is a 52 y.o. female presenting with ankle pain. The history is provided by the patient.  Ankle Pain  The incident occurred 6 to 12 hours ago. The incident occurred at work. 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 The pain is present in the left ankle. The pain is mild. Associated symptoms include inability to bear weight.    Past Medical History  Diagnosis Date  . Hypertension     History reviewed. No pertinent past surgical history.  Family History  Problem Relation Age of Onset  . Stroke Other   . Hypertension Other     History  Substance Use Topics  . Smoking status: Former Games developer  . Smokeless tobacco: Former Neurosurgeon    Quit date: 10/01/1997  . Alcohol Use: No    OB History    Grav Para Term Preterm Abortions TAB SAB Ect Mult Living                  Review of Systems  Constitutional: Negative.   Gastrointestinal: Negative.   Genitourinary: Negative.   Musculoskeletal: Positive for joint swelling and gait problem.    Allergies  Review of patient's allergies indicates no known allergies.  Home Medications   Current Outpatient Rx  Name  Route  Sig  Dispense  Refill  . HYDROCHLOROTHIAZIDE 25 MG PO TABS   Oral   Take 1 tablet (25 mg total) by mouth daily.   90 tablet   4   . HYDROCODONE-ACETAMINOPHEN 5-325 MG PO TABS   Oral   Take 1 tablet by mouth every 6 (six) hours as needed for pain.   10 tablet   0   . SUMATRIPTAN SUCCINATE 25 MG PO TABS   Oral   Take 25 mg by mouth. For migraine. May repeat in 2 hours            BP 144/89  Pulse 83  Temp 99.5 F  (37.5 C) (Oral)  Resp 16  SpO2 100%  Physical Exam  Nursing note and vitals reviewed. Constitutional: She is oriented to person, place, and time. She appears well-developed and well-nourished.  Musculoskeletal: She exhibits tenderness. She exhibits no edema.       Left ankle: She exhibits swelling. She exhibits normal range of motion, no ecchymosis, no deformity and normal pulse. tenderness. Lateral malleolus and CF ligament tenderness found. No head of 5th metatarsal and no proximal fibula tenderness found. Achilles tendon normal.  Neurological: She is alert and oriented to person, place, and time.  Skin: Skin is warm and dry.    ED Course  Procedures (including critical care time)  Labs Reviewed - No data to display Dg Ankle Complete Left  01/09/2012  *RADIOLOGY REPORT*  Clinical Data: Left ankle pain.  Left ankle trauma/injury.  LEFT ANKLE COMPLETE - 3+ VIEW  Comparison: None.  Findings: Anatomic alignment.  No fracture.  Mortise appears congruent.  Small calcaneal spurs incidentally noted.  IMPRESSION: Negative.   Original Report Authenticated By: Andreas Newport, M.D.      1. Left ankle sprain       MDM  X-rays reviewed  and report per radiologist.          Linna Hoff, MD 01/09/12 [phone]

## 2012-01-09 NOTE — ED Notes (Signed)
Waiting discharge papers 

## 2012-01-09 NOTE — ED Notes (Signed)
Pt c/o left ankle pain. Pt states that she was stepping down off curve and rolled ankle. Incident happened around 11:50 a.m. Pt states she worked on it all day but pain has gradually gotten worse and is barely able to put any pressure on ankle now.   Pt has used ice for comfort. Unsure of swelling pt has not removed shoe since incident.

## 2012-06-10 ENCOUNTER — Ambulatory Visit (INDEPENDENT_AMBULATORY_CARE_PROVIDER_SITE_OTHER): Payer: PRIVATE HEALTH INSURANCE | Admitting: Family Medicine

## 2012-06-10 ENCOUNTER — Other Ambulatory Visit (HOSPITAL_COMMUNITY)
Admission: RE | Admit: 2012-06-10 | Discharge: 2012-06-10 | Disposition: A | Discharge status: Home / Self Care | Payer: PRIVATE HEALTH INSURANCE | Source: Ambulatory Visit | Attending: Family Medicine | Admitting: Family Medicine

## 2012-06-10 ENCOUNTER — Encounter: Payer: Self-pay | Admitting: Family Medicine

## 2012-06-10 VITALS — BP 130/86 | HR 69 | Ht 66.0 in | Wt 180.0 lb

## 2012-06-10 DIAGNOSIS — Z124 Encounter for screening for malignant neoplasm of cervix: Secondary | ICD-10-CM

## 2012-06-10 DIAGNOSIS — Z01419 Encounter for gynecological examination (general) (routine) without abnormal findings: Secondary | ICD-10-CM | POA: Insufficient documentation

## 2012-06-10 DIAGNOSIS — I10 Essential (primary) hypertension: Secondary | ICD-10-CM

## 2012-06-10 DIAGNOSIS — Z1151 Encounter for screening for human papillomavirus (HPV): Secondary | ICD-10-CM | POA: Insufficient documentation

## 2012-06-10 NOTE — Progress Notes (Signed)
  Subjective:    Patient ID: Wendy Mann, female    DOB: 12/24/1960, 52 y.o.   MRN: [id number]  HPI  Patient presents for annual physical. Patient currently denies any issues. Exercises three times a week. Perimenopausal - LMP 6 months ago   Screenings - Patient due for screening colonoscopy and screening mammogram  Past Medical History  Diagnosis Date  . Hypertension   . History of cocaine use 2000    Attends [employer]    Family History  Problem Relation Age of Onset  . Stroke Mother   . Hypertension Mother   . Stroke Sister   . Heart disease Sister    History   Social History  . Marital Status: Married    Spouse Name: N/A    Number of Children: N/A  . Years of Education: N/A   Occupational History  . Not on file.   Social History Main Topics  . Smoking status: Former Games developer  . Smokeless tobacco: Former Neurosurgeon    Quit date: 10/01/1997  . Alcohol Use: No  . Drug Use: No  . Sexually Active: Yes    Birth Control/ Protection: None   Other Topics Concern  . Not on file   Social History Narrative   Husband with multiple myeloma and lives in the home -He attends [employer]   Has two older children not in the home and 37 year old twins    Current Outpatient Prescriptions on File Prior to Visit  Medication Sig Dispense Refill  . hydrochlorothiazide (HYDRODIURIL) 25 MG tablet Take 1 tablet (25 mg total) by mouth daily.  90 tablet  4   No current facility-administered medications on file prior to visit.     Review of Systems  All other systems reviewed and are negative.   Positive for swelling of right thumb      Objective:   Physical Exam BP 130/86  Pulse 69  Ht 5\' 6"  (1.676 m)  Wt 180 lb (81.647 kg)  BMI 29.07 kg/m2  Gen: middle aged AA female, well appearing, NAD, pleasant and conversant HEENT: NCAT, PERRLA, EOMI, OP clear and moist, no lymphadenopathy, no thyroid tenderness, enlargement, or nodules CV: RRR, no m/r/g, no JVD or carotid  bruits Pulm: normal WOB, CTA-B Breast: symmetric appearance and size, no palpable passes, no nipple discharge  Abd: soft, NDNT, NABS Extremities: no edema or joint tenderness, right distal thumb with mild tenderness but no swelling or evidence of injury Skin: warm, dry, no rashes Neuro/Psych: A&Ox4, normal affect, speech, and thought content GU: External: no lesions Vagina: no blood in vault Cervix: no lesion; no mucopurulent d/c Uterus: small, mobile Adnexa: no masses; non tender      Assessment & Plan:  Complete physical exam performed. Pap smear sent for evaluation. Given information to schedule colonoscopy and mammogram. Will return next week for labs draws.

## 2012-06-10 NOTE — Patient Instructions (Addendum)
Wendy Mann,   Thank you for coming to see me today. I will send the results of the Pap smear to you. Also, in the near future I recommend a mammogram for breast cancer screening and a colonoscopy for colon cancer screening.   Return next week for a lab visit. I will send you the results of the labs in the mail and let you know when to follow up.   Take care and congratulations on the twins graduation from 8th grade.   Sincerely,   Dr. Clinton Sawyer

## 2012-06-12 ENCOUNTER — Encounter: Payer: Self-pay | Admitting: Family Medicine

## 2012-06-13 ENCOUNTER — Encounter: Payer: Self-pay | Admitting: Family Medicine

## 2012-06-18 ENCOUNTER — Other Ambulatory Visit: Payer: Self-pay

## 2012-06-18 DIAGNOSIS — Z1231 Encounter for screening mammogram for malignant neoplasm of breast: Secondary | ICD-10-CM

## 2012-06-20 ENCOUNTER — Other Ambulatory Visit: Payer: PRIVATE HEALTH INSURANCE

## 2012-06-20 DIAGNOSIS — I10 Essential (primary) hypertension: Secondary | ICD-10-CM

## 2012-06-20 LAB — LIPID PANEL
Cholesterol: 174 mg/dL (ref 0–200)
Total CHOL/HDL Ratio: 2.4 Ratio

## 2012-06-20 LAB — BASIC METABOLIC PANEL
BUN: 16 mg/dL (ref 6–23)
Calcium: 9.8 mg/dL (ref 8.4–10.5)
Glucose, Bld: 92 mg/dL (ref 70–99)
Sodium: 139 mEq/L (ref 135–145)

## 2012-06-20 LAB — CBC
Hemoglobin: 11.9 g/dL — ABNORMAL LOW (ref 12.0–15.0)
MCH: 29.8 pg (ref 26.0–34.0)
MCHC: 32.7 g/dL (ref 30.0–36.0)

## 2012-06-20 NOTE — Progress Notes (Signed)
BMP,CBC AND FLP DONE TODAY Wendy Mann 

## 2012-06-23 ENCOUNTER — Ambulatory Visit: Payer: Self-pay

## 2012-06-23 ENCOUNTER — Encounter: Payer: Self-pay | Admitting: Family Medicine

## 2012-06-23 ENCOUNTER — Other Ambulatory Visit: Payer: Self-pay | Admitting: Family Medicine

## 2012-06-23 DIAGNOSIS — D72819 Decreased white blood cell count, unspecified: Secondary | ICD-10-CM

## 2012-07-02 ENCOUNTER — Ambulatory Visit
Admission: RE | Admit: 2012-07-02 | Discharge: 2012-07-02 | Disposition: A | Discharge status: Home / Self Care | Payer: PRIVATE HEALTH INSURANCE | Source: Ambulatory Visit

## 2012-07-02 DIAGNOSIS — Z1231 Encounter for screening mammogram for malignant neoplasm of breast: Secondary | ICD-10-CM

## 2012-08-15 ENCOUNTER — Encounter: Payer: Self-pay | Admitting: Family Medicine

## 2012-08-15 ENCOUNTER — Ambulatory Visit (INDEPENDENT_AMBULATORY_CARE_PROVIDER_SITE_OTHER): Payer: PRIVATE HEALTH INSURANCE | Admitting: Family Medicine

## 2012-08-15 ENCOUNTER — Other Ambulatory Visit (HOSPITAL_COMMUNITY)
Admission: RE | Admit: 2012-08-15 | Discharge: 2012-08-15 | Disposition: A | Discharge status: Home / Self Care | Payer: PRIVATE HEALTH INSURANCE | Source: Ambulatory Visit | Attending: Family Medicine | Admitting: Family Medicine

## 2012-08-15 VITALS — BP 128/83 | HR 67 | Ht 66.0 in | Wt 186.0 lb

## 2012-08-15 DIAGNOSIS — L719 Rosacea, unspecified: Secondary | ICD-10-CM

## 2012-08-15 DIAGNOSIS — Z113 Encounter for screening for infections with a predominantly sexual mode of transmission: Secondary | ICD-10-CM | POA: Insufficient documentation

## 2012-08-15 DIAGNOSIS — Z9189 Other specified personal risk factors, not elsewhere classified: Secondary | ICD-10-CM

## 2012-08-15 DIAGNOSIS — Z202 Contact with and (suspected) exposure to infections with a predominantly sexual mode of transmission: Secondary | ICD-10-CM | POA: Insufficient documentation

## 2012-08-15 DIAGNOSIS — L710 Perioral dermatitis: Secondary | ICD-10-CM

## 2012-08-15 LAB — POCT URINALYSIS DIPSTICK
Glucose, UA: NEGATIVE
Nitrite, UA: NEGATIVE
Protein, UA: NEGATIVE
Urobilinogen, UA: 0.2

## 2012-08-15 LAB — POCT UA - MICROSCOPIC ONLY

## 2012-08-15 NOTE — Patient Instructions (Signed)
Thank you for coming in, today! It was nice to see you. I think the rash around your mouth could be herpes, but I don't see any definite suspicious blisters, today. If you have a definite blister come up, call us to make an appointment to have it tested for herpes. We will check your blood for HIV and syphilis, today. I think it is very unlikely that you have any vaginal infection since you have no symptoms and had a recently normal exam. We will check your urine for infections, today. Make sure you ALWAYS use some form of protection during sex. I will call you once your results come back, and I will let Dr. Clinton Sawyer know what we talked about, as well. One thing that may help the dryness around your mouth is taking a multivitamin. Sometimes vitamin deficiency can cause rashes. Please make an appointment to see him as you need and call at any time with questions. Please feel free to call with any questions or concerns at any time, at [phone]. --Dr. Kathlene November Sore A cold sore (fever blister) is a skin infection caused by the herpes simplex virus (HSV-1). HSV-1 is closely related to the virus that causes gential herpes (HSV-2), but they are not the same even though both viruses can cause oral and genital infections. Cold sores are small, fluid-filled sores inside of the mouth or on the lips, gums, nose, chin, cheeks, or fingers.  The herpes simplex virus can be easily passed (contagious) to other people through close personal contact, such as kissing or sharing personal items. The virus can also spread to other parts of the body, such as the eyes or genitals. Cold sores are contagious until the sores crust over completely. They often heal within 2 weeks.  Once a person is infected, the herpes simplex virus remains permanently in the body. Therefore, there is no cure for cold sores, and they often recur when a person is tired, stressed, sick, or gets too much sun. Additional factors that can cause  a recurrence include hormone changes in menstruation or pregnancy, certain drugs, and cold weather.  CAUSES  Cold sores are caused by the herpes simplex virus. The virus is spread from person to person through close contact, such as through kissing, touching the affected area, or sharing personal items such as lip balm, razors, or eating utensils.  SYMPTOMS  The first infection may not cause symptoms. If symptoms develop, the symptoms often go through different stages. Here is how a cold sore develops:   Tingling, itching, or burning is felt 1 2 days before the outbreak.   Fluid-filled blisters appear on the lips, inside the mouth, nose, or on the cheeks.   The blisters start to ooze clear fluid.   The blisters dry up and a yellow crust appears in its place.   The crust falls off.  Symptoms depend on whether it is the initial outbreak or a recurrence. Some other symptoms with the first outbreak may include:   Fever.   Sore throat.   Headache.   Muscle aches.   Swollen neck glands.  DIAGNOSIS  A diagnosis is often made based on your symptoms and looking at the sores. Sometimes, a sore may be swabbed and then examined in the lab to make a final diagnosis. If the sores are not present, blood tests can find the herpes simplex virus.  TREATMENT  There is no cure for cold sores and no vaccine for the herpes simplex virus. Within 2  weeks, most cold sores go away on their own without treatment. Medicines cannot make the infection go away, but medicine can help relieve some of the pain associated with the sores, can work to stop the virus from multiplying, and can also shorten healing time. Medicine may be in the form of creams, gels, pills, or a shot.  HOME CARE INSTRUCTIONS   Only take over-the-counter or prescription medicines for pain, discomfort, or fever as directed by your caregiver. Do not use aspirin.   Use a cotton-tip swab to apply creams or gels to your sores.   Do  not touch the sores or pick the scabs. Wash your hands often. Do not touch your eyes without washing your hands first.   Avoid kissing, oral sex, and sharing personal items until sores heal.   Apply an ice pack on your sores for 10 15 minutes to ease any discomfort.   Avoid hot, cold, or salty foods because they may hurt your mouth. Eat a soft, bland diet to avoid irritating the sores. Use a straw to drink if you have pain when drinking out of a glass.   Keep sores clean and dry to prevent an infection of other tissues.   Avoid the sun and limit stress if these things trigger outbreaks. If sun causes cold sores, apply sunscreen on the lips before being out in the sun.  SEEK MEDICAL CARE IF:   You have a fever or persistent symptoms for more than 2 3 days.   You have a fever and your symptoms suddenly get worse.   You have pus, not clear fluid, coming from the sores.   You have redness that is spreading.   You have pain or irritation in your eye.   You get sores on your genitals.   Your sores do not heal within 2 weeks.   You have a weakened immune system.   You have frequent recurrences of cold sores.  MAKE SURE YOU:   Understand these instructions.  Will watch your condition.  Will get help right away if you are not doing well or get worse. Document Released: 12/16/1999 Document Revised: 09/12/2011 Document Reviewed: 05/02/2011 [facility] Patient Information 2014 [place], [place].

## 2012-08-19 NOTE — Progress Notes (Signed)
  Subjective:    Patient ID: Wendy Mann, female    DOB: 1960-11-16, 52 y.o.   MRN: [id number]  HPI: Pt presents to clinic for SDA for concern about rash/bumps around mouth for 4-5 days. Pt states she had similar bumps/rash about 9-10 months ago. Pt talked to a friend who told her "it might be herpes," and pt became very concerned and wanted to be checked out. Pt has used Carmex and Blistex with some relief. Pt also used [employer] mask with some relief. Pt is very worried what this rash is and is interested in STD screening. Pt states she is "in recovery" from chronic drug/alcohol abuse; pt is sober 14 years and states she used "everything," though cocaine was her drug of choice. Denies recent use but states she is currently sexually active. Last intercourse 2 weeks ago; pt has been active with men and women both in the past, currently only active with one female partner and states she always uses condoms for protection. Pt has a history of canker sores but states she has had no oral lesions other than that in the past. Pt thinks she may have been tested for herpes before, but she is unsure. Pt denies fever/chills, N/V, vaginal/vulvar lesions, discharge, bleeding/burning, discharge, or dysuria. In general, she states she feels well but is worried about infections. Pt does state she recently had a pap smear and was told everything "looked normal," and states she is relieved by this, as she thinks "if something was there, it would have shown up."  Review of Systems: As above.     Objective:   Physical Exam Gen: adult female in NAD, mildly anxious HEENT: no oral lesions noted, no frank rash around lips/mouth noted; no distinct blisters suggestive of cold sore or herpetiform lesions Cardio: RRR, no murmur appreciated Pulm: CTAB, no wheezes Abd: soft, nontender, ND, no CVA tenderness, no flank tenderness Ext: warm, well-perfused, no LE edema GU: deferred per pt preference and lack of vaginal/vulvar  symptoms     Assessment & Plan:

## 2012-08-20 NOTE — Assessment & Plan Note (Signed)
No distinct lesions or rash noted, certainly no findings to suggest perioral herpes. No blister to unroof for viral culture. Believe blood test for herpes would be of limited use (would help eval for prior exposure to herpes but would not necessarily identify active infection or differentiate oral vs genital source/nidus of infection). Explained extensively that even if pt does have oral/perioral herpes that this does not necessarily mean she has genital herpes. Regardless counseled on safe sexual practices and advised close f/u with PCP if needed. Recommended multivitamin daily in case dermatitis is reflective of mild vitamin/mineral deficiency. See also possible exposure to STD problem list note.

## 2012-08-20 NOTE — Assessment & Plan Note (Signed)
Deferred GU exam given report of no vaginal/vulvar lesions or symptoms, as well as recent pap smear without apparent abnormality. No strong evidence for oral herpes, and counseled extensively that oral and genital herpes are not the same thing (i.e., having one does not imply/necessitate the other, etc). HIV, RPR, urinalysis, and GC/Chlamydia urine probe obtained this visit, all negative (left voicemail for pt to call for results). Advised f/u with PCP Dr. Clinton Sawyer in 1-2 weeks or sooner if any symptoms develop. Strongly recommended following up immediately if definite blisters develop, for viral culture/definitive diagnosis (or not) of herpes, oral or genital. Advised safe sex practices regardless.

## 2012-12-29 ENCOUNTER — Other Ambulatory Visit: Payer: Self-pay | Admitting: Family Medicine

## 2013-04-20 ENCOUNTER — Encounter: Payer: Self-pay | Admitting: Family Medicine

## 2013-04-20 ENCOUNTER — Ambulatory Visit (INDEPENDENT_AMBULATORY_CARE_PROVIDER_SITE_OTHER): Payer: PRIVATE HEALTH INSURANCE | Admitting: Family Medicine

## 2013-04-20 VITALS — BP 117/77 | HR 66 | Temp 98.3°F | Ht 66.0 in | Wt 178.8 lb

## 2013-04-20 DIAGNOSIS — F1911 Other psychoactive substance abuse, in remission: Secondary | ICD-10-CM

## 2013-04-20 DIAGNOSIS — R229 Localized swelling, mass and lump, unspecified: Secondary | ICD-10-CM

## 2013-04-20 DIAGNOSIS — G4700 Insomnia, unspecified: Secondary | ICD-10-CM

## 2013-04-20 DIAGNOSIS — R2231 Localized swelling, mass and lump, right upper limb: Secondary | ICD-10-CM | POA: Insufficient documentation

## 2013-04-20 DIAGNOSIS — F5101 Primary insomnia: Secondary | ICD-10-CM | POA: Insufficient documentation

## 2013-04-20 MED ORDER — RAMELTEON 8 MG PO TABS: 8.0000 mg | ORAL_TABLET | Freq: Every evening | ORAL | Status: DC | PRN

## 2013-04-20 MED ORDER — HYDROXYZINE PAMOATE 25 MG PO CAPS: 25.0000 mg | ORAL_CAPSULE | Freq: Every evening | ORAL | Status: DC | PRN

## 2013-04-20 NOTE — Assessment & Plan Note (Signed)
A: chronic with increased sleep latency and decreased duration and nighttime awakenings  P: after thorough eval, no clear behavioral modifications, so I will try a medicine; I recommend a melatonin agonist, Rozerem; pt counseled on use and precautions; also given Rx for vistaril if she cannot afford rozerem

## 2013-04-20 NOTE — Progress Notes (Signed)
   Subjective:    Patient ID: Wendy Mann, female    DOB: 1960/09/09, 53 y.o.   MRN: [id number]  HPI  53 year old F with HTN and hx of substance abuse in remission for > 10 years who presents for evaluation of insomnia and knots on her hand.   1. Insomnia - first evaluation for this issue > Duration - 10 years duration but worsening in last 6-12 months > Sleep pattern: pt in bed by 10 PM, falls asleep around 11:30, awakens to use the restroom at night and has difficulty falling back asleep; pt does not usually watch TV prior to bed but will occasionally use the Internet for a while and always talks to her daughter around 9:45 PM each night; awakens at 6:45 AM to care for husband who has cancer > meds: HCTZ for BP, has tried melatonin and healty-time tea, "Relax-Now" tablet and [employer] brand sleep aid > stimulants: caffeine use includes herbal tea (4 cups daily) and healthy shake with caffeine but last at 2:30 PM > regular exercise - yes Zumba, finishes at 7:30 PM,   2. Knots on hand - new problem; right hand, 3rd digit on DIP joint, and IP joint of thumb, firm nodule and non-tender, no swelling or redness, no fever, no other digits involved, no trauma  PMH - polysubstance abuse in remission for 10 years   Social - pt with twins who are in high school, working at [employer] and working as Insurance claims handlereer Support Counselor  Current Outpatient Prescriptions on File Prior to Visit  Medication Sig Dispense Refill  . hydrochlorothiazide (HYDRODIURIL) 25 MG tablet TAKE 1 TABLET (25 MG TOTAL) BY MOUTH DAILY.  90 tablet  4   No current facility-administered medications on file prior to visit.  '  Review of Systems Positive for headache in the morning  Negative for substance abuse , fever, chills, weight loss, muscle ache    Objective:   Physical Exam BP 117/77  Pulse 66  Temp(Src) 98.3 F (36.8 C) (Oral)  Ht 5\' 6"  (1.676 m)  Wt 178 lb 12.8 oz (81.103 kg)  BMI 28.87 kg/m2  Gen: middle  aged AA female, well appearing, NAD, pleasant and conversant Hands: small, firm, subcutaneous nodule or right DIP joint of right 3rd finger and right thumb IP joint; no swelling or redness, non tender, normal flexion and extension of fingers  MSK: other joints without swelling or nodules  Psych: A&Ox4, normal affect, speech, and thought content Neuro: grip strength 5/5 and sensation intact to hands bilaterally  Wt Readings from Last 5 Encounters:  04/20/13 178 lb 12.8 oz (81.103 kg)  08/15/12 186 lb (84.369 kg)  06/10/12 180 lb (81.647 kg)  10/02/11 183 lb 8 oz (83.235 kg)  10/06/09 188 lb 7 oz (85.475 kg)         Assessment & Plan:

## 2013-04-20 NOTE — Assessment & Plan Note (Signed)
No concern for infection or systemic illness, may be chronic changes of OA or calcified tendons, no work up needed

## 2013-04-20 NOTE — Patient Instructions (Signed)
Ms. Hester MatesMcCoy,   Great to see you. The knots on the fingers looks like normal changes from using those fingers a lot. For the sleep, continue your good habits. We can try to rozerem as needed. Please allow at least 8 hours of sleep. Do not drive while using this. Also, do not take this and vistaril together.   Please follow up in 4 weeks. Tell the twins hello for me.   Take Care,   Dr. Clinton SawyerWilliamson

## 2013-05-06 ENCOUNTER — Encounter: Payer: Self-pay | Admitting: Emergency Medicine

## 2013-05-06 ENCOUNTER — Ambulatory Visit (INDEPENDENT_AMBULATORY_CARE_PROVIDER_SITE_OTHER): Payer: PRIVATE HEALTH INSURANCE | Admitting: Emergency Medicine

## 2013-05-06 VITALS — BP 118/78 | HR 60 | Ht 66.0 in | Wt 183.0 lb

## 2013-05-06 DIAGNOSIS — R21 Rash and other nonspecific skin eruption: Secondary | ICD-10-CM | POA: Insufficient documentation

## 2013-05-06 NOTE — Patient Instructions (Signed)
It was nice to meet you!  I likely had a reaction to the penicillin. You can take the hydroxyzine 2 at bedtime and 1 during the day to help with any additional itching. If you get more welts, use hydrocortisone cream on them.  Follow up with Dr. Clinton SawyerWilliamson as scheduled.

## 2013-05-06 NOTE — Assessment & Plan Note (Signed)
Most likely reaction to PCN.  Allergy added to chart. She is off the PCN and her rash is resolved. Take hydroxyzine as needed for any residual itching. Use hydrocortisone cream if any wheals recur. F/u with PCP in 2 weeks as scheduled.

## 2013-05-06 NOTE — Progress Notes (Signed)
   Subjective:    Patient ID: Wendy Mann, female    DOB: 1960/06/08, 53 y.o.   MRN: [id number]  HPI Wendy Mann is here for a same-day appointment for itchy rash.  She states she was on a course of penicillin for some sort of mouth infection starting April 20. She completed the last dose on April 27, and started having itching on April 28. Shortly after the itching started she noticed a raised red rash. It was worse on her wrists, axilla, stomach, groin. There is no rash on her palms or soles. She states she had a sensation of her throat closing on the first day only. Denies any trouble breathing after that. No fevers or chills. She has taken amoxicillin in the last few years without any trouble. Denies any known history of allergies, either seasonal or to medication.  Current Outpatient Prescriptions on File Prior to Visit  Medication Sig Dispense Refill  . hydrochlorothiazide (HYDRODIURIL) 25 MG tablet TAKE 1 TABLET (25 MG TOTAL) BY MOUTH DAILY.  90 tablet  4  . hydrOXYzine (VISTARIL) 25 MG capsule Take 1 capsule (25 mg total) by mouth at bedtime as needed.  30 capsule  0  . ramelteon (ROZEREM) 8 MG tablet Take 1 tablet (8 mg total) by mouth at bedtime as needed for sleep.  30 tablet  1   No current facility-administered medications on file prior to visit.    I have reviewed and updated the following as appropriate: allergies and current medications SHx: former smoker   Review of Systems See HPI    Objective:   Physical Exam BP 118/78  Pulse 60  Ht 5\' 6"  (1.676 m)  Wt 183 lb (83.008 kg)  BMI 29.55 kg/m2 Gen: alert, cooperative, NAD HEENT: no edema of neck, tongue or palate Pulm: CTAB Skin: no rashes or lesions      Assessment & Plan:

## 2013-05-20 ENCOUNTER — Other Ambulatory Visit: Payer: Self-pay | Admitting: Family Medicine

## 2013-05-20 ENCOUNTER — Ambulatory Visit (INDEPENDENT_AMBULATORY_CARE_PROVIDER_SITE_OTHER): Payer: PRIVATE HEALTH INSURANCE | Admitting: Family Medicine

## 2013-05-20 ENCOUNTER — Encounter: Payer: Self-pay | Admitting: Family Medicine

## 2013-05-20 VITALS — BP 130/71 | HR 64 | Temp 98.0°F | Ht 66.0 in | Wt 182.0 lb

## 2013-05-20 DIAGNOSIS — G479 Sleep disorder, unspecified: Secondary | ICD-10-CM

## 2013-05-20 DIAGNOSIS — G4700 Insomnia, unspecified: Secondary | ICD-10-CM

## 2013-05-20 DIAGNOSIS — Z1211 Encounter for screening for malignant neoplasm of colon: Secondary | ICD-10-CM | POA: Insufficient documentation

## 2013-05-20 DIAGNOSIS — F5101 Primary insomnia: Secondary | ICD-10-CM

## 2013-05-20 MED ORDER — HYDROXYZINE HCL 50 MG PO TABS: 50.0000 mg | ORAL_TABLET | Freq: Three times a day (TID) | ORAL | Status: DC | PRN

## 2013-05-20 NOTE — Patient Instructions (Signed)
Ms. Massie MaroonMcCoy,   As usual, it was great to see you today. Please read below.   1. Sleeping issues - I am glad that this is improving. It was smart to cut back on the tea. Please use the hydroxyzine as needed at night.  2. Colon cancer screening - Now is an appropriate time for a colonoscopy. Please call the numbers listed on the sheet that I gave you.  I will be here until June 30. Please let me know if there is anything you need before I leave. It has been an honor taking care of you and your family. I'm sure you'll be in great hands when I leave.  Sincerely,   Dr. Clinton SawyerWilliamson

## 2013-05-20 NOTE — Progress Notes (Addendum)
   Subjective:    Patient ID: Wendy Mann, female    DOB: 21-Aug-1960, 53 y.o.   MRN: [id number]  HPI  53 year old F with HTN and hx of substance abuse in remission for > 10 years who presents for follow up of insomnia.   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 The plan was to start rozerem (a melatonon agonist) for treatment. If this was not available, she was going to use hydroxyzine PRN. She states that she was not able to purchase the Rozerem, because it was $373 per month. Therefore she purchase the hydroxyzine. She has been using it approximately 3 times a week. It is most effective a 50 mg dose. It does not make her drowsy the next day. Overall, she feels that her sleep is improving. She relates it to a decrease in the amount of tea she drinks today. She now drinks her last cup at noon and feels like this is the biggest help.  Colon cancer screening - Patient needs a colonoscopy. She denies any constipation, melena or hematochezia. No fam hx of colon cancer.    Review of Systems Negative for fatigue, depression, rash ( 2 weeks ago she had an allergic reaction to penicillin)    Objective:   Physical Exam BP 130/71  Pulse 64  Temp(Src) 98 F (36.7 C) (Oral)  Ht 5\' 6"  (1.676 m)  Wt 182 lb (82.555 kg)  BMI 29.39 kg/m2 Gen: well-appearing, middle age AAF non-distress, pleasant conversant Neuro: alert and oriented x 4, no signs fatigue or depression      Assessment & Plan:

## 2013-05-20 NOTE — Assessment & Plan Note (Signed)
A: pt at normal risk level P: given contact information for GI offices, and pt will contact them

## 2013-05-20 NOTE — Assessment & Plan Note (Signed)
A: improving  P: continue behavioral modification with decrease tea consumption and use hydroxyzine PRN

## 2013-06-02 ENCOUNTER — Telehealth: Payer: Self-pay | Admitting: Family Medicine

## 2013-06-02 ENCOUNTER — Other Ambulatory Visit: Payer: Self-pay

## 2013-06-02 DIAGNOSIS — Z1231 Encounter for screening mammogram for malignant neoplasm of breast: Secondary | ICD-10-CM

## 2013-06-02 NOTE — Telephone Encounter (Signed)
Please send referral to [facility] fax # [phone] for patient's screening colonoscopy.  Please inform when appt scheduled.

## 2013-06-02 NOTE — Telephone Encounter (Signed)
Please call the patient to clarify what she needs. I have never had to write a referral for a screening colonoscopy before. Furthermore, I am not sure who [facility] is. I am only familiar with [facility], which is an internal medicine practice.

## 2013-06-02 NOTE — Telephone Encounter (Signed)
Will fwd to PCP for review. Thanks. .Ameria Sanjurjo  

## 2013-06-04 NOTE — Telephone Encounter (Signed)
Patient states that she does need a referral and OV notes sent to [facility] which is one of the places listed on the back of the colonoscopy sheet

## 2013-06-15 ENCOUNTER — Telehealth: Payer: Self-pay | Admitting: *Deleted

## 2013-06-15 ENCOUNTER — Telehealth: Payer: Self-pay | Admitting: Family Medicine

## 2013-06-15 NOTE — Telephone Encounter (Signed)
Lab work completed in April for problem on top lip Is now having the same problem.  She never got the lab results Wonders what this is Please advise

## 2013-06-15 NOTE — Telephone Encounter (Signed)
Reviewed and agree with plan.   Marena ChancyStephanie Kyana Aicher, PGY-3 Family Medicine Resident

## 2013-06-15 NOTE — Telephone Encounter (Signed)
Can be called at work [phone]

## 2013-06-15 NOTE — Telephone Encounter (Signed)
Pt called and stated she has blisters on her mouth.  Pt said she was here a couple of months ago and thought cultures were done.  Pt stated this is not the first time the blisters has formed on her lips.  Pt is very concerns regarding this matter.  Appt has 06/17/2013 on crosscover.  Pt advised to keep appt and the provider will discuss concerns at that time.  Pt denies any drainage from blisters.  [name], Iman Reinertsen L, RN\

## 2013-06-16 ENCOUNTER — Telehealth: Payer: Self-pay | Admitting: Family Medicine

## 2013-06-16 ENCOUNTER — Ambulatory Visit (INDEPENDENT_AMBULATORY_CARE_PROVIDER_SITE_OTHER): Payer: PRIVATE HEALTH INSURANCE | Admitting: Emergency Medicine

## 2013-06-16 ENCOUNTER — Encounter: Payer: Self-pay | Admitting: Emergency Medicine

## 2013-06-16 VITALS — BP 127/78 | HR 82 | Temp 98.0°F | Wt 185.0 lb

## 2013-06-16 DIAGNOSIS — R21 Rash and other nonspecific skin eruption: Secondary | ICD-10-CM

## 2013-06-16 MED ORDER — VALACYCLOVIR HCL 500 MG PO TABS: ORAL_TABLET | ORAL | Status: DC

## 2013-06-16 MED ORDER — ACYCLOVIR 400 MG PO TABS: 400.0000 mg | ORAL_TABLET | Freq: Three times a day (TID) | ORAL | Status: DC

## 2013-06-16 NOTE — Patient Instructions (Signed)
It was nice to see you!  I think you have herpes of your mouth. Use a lip balm daily (different from the one you use without lesions). Take Valtrex 1 pill twice a day for 3 days.  You can repeat this at the start of a breakout.  We are checking blood work.  I will call you with the results the end of the week.  Cold Sore A cold sore (fever blister) is a skin infection caused by a certain type of germ (virus). They are small sores filled with fluid that dry up and heal within 2 weeks. Cold sores form inside of the mouth or on the lips, gums, and other parts of the body. Cold sores can be easily passed (contagious) to other people. This can happen through close personal contact, such as kissing or sharing a drinking glass. HOME CARE  Only take medicine as told by your doctor. Do not use aspirin.  Use a cotton-tip swab to put creams or gels on your sores.  Do not touch sores or pick scabs. Wash your hands often. Do not touch your eyes without washing your hands first.  Avoid kissing, oral sex, and sharing personal items until the sores heal.  Put an ice pack on your sores for 10 15 minutes to ease discomfort.  Avoid hot, cold, or salty foods. Eat a soft, bland diet. Use a straw to drink if it helps lessen pain.  Keep sores clean and dry.  Avoid the sun and limit stress if these things cause you to have sores. Apply sunscreen on your lips if the sun causes cold sores. GET HELP IF:  You have a fever or lasting symptoms for more than 2 3 days.  You have a fever and your symptoms suddenly get worse.  You have yellow-white fluid (not clear) coming from the sores.  You have redness that is spreading.  You have pain or irritation in your eye.  You get sores on your genitals.  Your sores do not heal within 2 weeks.  You have a tough time fighting off sickness and infections (weakened immune system).  You get cold sores often. MAKE SURE YOU:   Understand these  instructions.  Will watch your condition.  Will get help right away if you are not doing well or get worse. Document Released: 06/19/2011 Document Reviewed: 06/19/2011 [facility] Patient Information 2014 [place], [place].

## 2013-06-16 NOTE — Telephone Encounter (Signed)
Patient is being seen in the office today by Dr Piedad ClimesHonig.Busick, Rodena Medinobert Lee

## 2013-06-16 NOTE — Assessment & Plan Note (Addendum)
Suspect oral herpes given the recurrent nature of the lesions. Sent prescription for Valtrex 500 mg twice a day for 3 days. Additional pills provided to use at the start of outbreak. Provided extensive education on herpes virus. Will check HSV 1 and HSV-2 titers. I will call her with these results. If she has frequent recurrences, would recommend starting daily prophylaxis. Followup as needed.  Addendum: Valtrex too expensive, will change to acyclovir.

## 2013-06-16 NOTE — Progress Notes (Signed)
   Subjective:    Patient ID: Wendy Mann, female    DOB: 1960-09-28, 53 y.o.   MRN: [id number]  HPI Wendy Mann is here for a same-day appointment for rash on lip.  She states she developed a bumpy rash on her upper lip about 3 days ago. This is the third time in the last year that she has had this rash. Most recently he was about 3 weeks ago. She states it lasted about a week and then resolved. She used Abreva on it. Prior to the rash starting, she states her lip felt funny. It does not really hurt or itch, but she finds it very unsightly. She had a lot of questions about potential herpes virus. No new foods or medications.  Current Outpatient Prescriptions on File Prior to Visit  Medication Sig Dispense Refill  . hydrochlorothiazide (HYDRODIURIL) 25 MG tablet TAKE 1 TABLET (25 MG TOTAL) BY MOUTH DAILY.  90 tablet  4  . hydrOXYzine (ATARAX/VISTARIL) 50 MG tablet Take 1 tablet (50 mg total) by mouth 3 (three) times daily as needed.  30 tablet  5  . hydrOXYzine (VISTARIL) 25 MG capsule Take 1 capsule (25 mg total) by mouth at bedtime as needed.  30 capsule  0  . ramelteon (ROZEREM) 8 MG tablet Take 1 tablet (8 mg total) by mouth at bedtime as needed for sleep.  30 tablet  1   No current facility-administered medications on file prior to visit.    I have reviewed and updated the following as appropriate: allergies and current medications SHx: former smoker  Review of Systems See HPI    Objective:   Physical Exam BP 127/78  Pulse 82  Temp(Src) 98 F (36.7 C) (Oral)  Wt 185 lb (83.915 kg) Gen: alert, cooperative, NAD Skin: erythematous bumpy rash along the upper lip vermillion border; no frank vesicles     Assessment & Plan:

## 2013-06-16 NOTE — Addendum Note (Signed)
Addended by: Charm RingsHONIG, ERIN J on: 06/16/2013 11:22 AM   Modules accepted: Orders, Medications

## 2013-06-16 NOTE — Telephone Encounter (Signed)
Pt went to get Valtrex. Because she doesn't have insurance, it is $189.  She cannot afford that. Wants to know if there are any samples at the clinic or something cheaper Please advise

## 2013-06-16 NOTE — Telephone Encounter (Signed)
Acyclovir sent to pharmacy, patient aware.

## 2013-06-18 LAB — HSV(HERPES SIMPLEX VRS) I + II AB-IGG
HSV 1 Glycoprotein G Ab, IgG: 1.06 IV — ABNORMAL HIGH
HSV 2 Glycoprotein G Ab, IgG: 6.75 IV — ABNORMAL HIGH

## 2013-06-19 ENCOUNTER — Telehealth: Payer: Self-pay | Admitting: Emergency Medicine

## 2013-06-19 NOTE — Telephone Encounter (Signed)
Spoke with patient and informed her of below 

## 2013-06-19 NOTE — Telephone Encounter (Signed)
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  This means the lesions on her lip are likely herpes.  She should take the Valtrex at the start of outbreaks like we discussed at her appointment.  If she is having frequent outbreaks, she should make an appointment to see her doctor to discuss daily prophylaxis.

## 2013-06-26 ENCOUNTER — Encounter: Payer: Self-pay | Admitting: Family Medicine

## 2013-06-26 ENCOUNTER — Ambulatory Visit (INDEPENDENT_AMBULATORY_CARE_PROVIDER_SITE_OTHER): Payer: PRIVATE HEALTH INSURANCE | Admitting: Family Medicine

## 2013-06-26 VITALS — BP 122/89 | HR 74 | Temp 98.1°F | Wt 178.0 lb

## 2013-06-26 DIAGNOSIS — R894 Abnormal immunological findings in specimens from other organs, systems and tissues: Secondary | ICD-10-CM

## 2013-06-26 DIAGNOSIS — Z20828 Contact with and (suspected) exposure to other viral communicable diseases: Secondary | ICD-10-CM

## 2013-06-26 DIAGNOSIS — R7309 Other abnormal glucose: Secondary | ICD-10-CM

## 2013-06-26 DIAGNOSIS — L719 Rosacea, unspecified: Secondary | ICD-10-CM

## 2013-06-26 DIAGNOSIS — L710 Perioral dermatitis: Secondary | ICD-10-CM

## 2013-06-26 DIAGNOSIS — R739 Hyperglycemia, unspecified: Secondary | ICD-10-CM

## 2013-06-26 LAB — POCT GLYCOSYLATED HEMOGLOBIN (HGB A1C): Hemoglobin A1C: 6.1

## 2013-06-29 ENCOUNTER — Telehealth: Payer: Self-pay | Admitting: Family Medicine

## 2013-06-29 DIAGNOSIS — R894 Abnormal immunological findings in specimens from other organs, systems and tissues: Secondary | ICD-10-CM | POA: Insufficient documentation

## 2013-06-29 DIAGNOSIS — R21 Rash and other nonspecific skin eruption: Secondary | ICD-10-CM

## 2013-06-29 LAB — HERPES SIMPLEX VIRUS CULTURE: Organism ID, Bacteria: NOT DETECTED

## 2013-06-29 MED ORDER — ACYCLOVIR 400 MG PO TABS: 400.0000 mg | ORAL_TABLET | Freq: Three times a day (TID) | ORAL | Status: DC

## 2013-06-29 MED ORDER — ACYCLOVIR 400 MG PO TABS: 400.0000 mg | ORAL_TABLET | Freq: Two times a day (BID) | ORAL | Status: DC

## 2013-06-29 NOTE — Progress Notes (Signed)
   Subjective:    Patient ID: Wendy EatonJacqueline A Clayburn, female    DOB: 07/31/1960, 53 y.o.   MRN: [id number]  HPI  Pt following up on oral lesion. 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 The patient was also tested for HSV1 and HSV2 antibodies, which were positive. She presents today, because she is extremely distressed and has several questions. Specifically, she would like to know if she is going to spread herpes to other parts of her body, if she is going to give it to someone if she kisses them, if she will ever have an outbreak of genital herpes and if she should be on medication. She is also paranoid that other lesion on her body including her abdomen and leg could be herpetic lesions.    Current Outpatient Prescriptions on File Prior to Visit  Medication Sig Dispense Refill  . acyclovir (ZOVIRAX) 400 MG tablet Take 1 tablet (400 mg total) by mouth 3 (three) times daily. For 5 days at start of outbreak  45 tablet  0  . hydrochlorothiazide (HYDRODIURIL) 25 MG tablet TAKE 1 TABLET (25 MG TOTAL) BY MOUTH DAILY.  90 tablet  4  . hydrOXYzine (ATARAX/VISTARIL) 50 MG tablet Take 1 tablet (50 mg total) by mouth 3 (three) times daily as needed.  30 tablet  5  . hydrOXYzine (VISTARIL) 25 MG capsule Take 1 capsule (25 mg total) by mouth at bedtime as needed.  30 capsule  0  . ramelteon (ROZEREM) 8 MG tablet Take 1 tablet (8 mg total) by mouth at bedtime as needed for sleep.  30 tablet  1   No current facility-administered medications on file prior to visit.     Review of Systems Positive for tingling and itching on upper lip; anxiety  Negative for painful sores or lips or genitals    Objective:   Physical Exam  BP 122/89  Pulse 74  Temp(Src) 98.1 F (36.7 C) (Oral)  Wt 178 lb (80.74 kg) Gen: middle age AAF, very distressed, non ill appearing Lips: very small, < 1 mm lesion on  upper lip without vesicle or crusting; hypopigmentation over the upper lip which the patient claims is due to a cream she has been using but cannot remember the name Skin: small pustule on right lower abdomen and left leg, approximately 1-2 mm consistent with sterile pustule vs herpetic vesicle      Assessment & Plan:

## 2013-06-29 NOTE — Assessment & Plan Note (Signed)
Pt positive for HSV 1 and HSV 2; this is very distressing to the patient; pt was educated on the topic and given precautions about transmission

## 2013-06-29 NOTE — Assessment & Plan Note (Signed)
Check herpes viral culture of small oral lesion; encouraged to quit using cream that was causing hypopigmentation

## 2013-06-29 NOTE — Telephone Encounter (Signed)
Pt called and informed of results. Will start acyclovir 400 mg po bid for suppression. Pt voiced understanding.

## 2013-07-01 ENCOUNTER — Telehealth: Payer: Self-pay | Admitting: Family Medicine

## 2013-07-01 NOTE — Telephone Encounter (Signed)
Was diagnosed with herpes 2 weeks agon. She hasnt stop taking other medicine and fills it is coming back Please advise

## 2013-07-07 ENCOUNTER — Encounter (INDEPENDENT_AMBULATORY_CARE_PROVIDER_SITE_OTHER): Payer: Self-pay

## 2013-07-07 ENCOUNTER — Ambulatory Visit
Admission: RE | Admit: 2013-07-07 | Discharge: 2013-07-07 | Disposition: A | Discharge status: Home / Self Care | Payer: No Typology Code available for payment source | Source: Ambulatory Visit

## 2013-07-07 DIAGNOSIS — Z1231 Encounter for screening mammogram for malignant neoplasm of breast: Secondary | ICD-10-CM

## 2013-07-07 NOTE — Telephone Encounter (Signed)
Patient is on proper mediation for Herpes. As long as she is taking as prescribed then the outbreaks will be controlled.   Wendy RudeJeremy E Cathan Gearin, MD PGY-2, [facility] 07/07/2013, 11:49 AM

## 2013-07-07 NOTE — Telephone Encounter (Signed)
Spoke with patient and she states that her outbreak has gone away

## 2014-01-11 ENCOUNTER — Other Ambulatory Visit: Payer: Self-pay | Admitting: *Deleted

## 2014-01-11 DIAGNOSIS — R21 Rash and other nonspecific skin eruption: Secondary | ICD-10-CM

## 2014-01-11 MED ORDER — ACYCLOVIR 400 MG PO TABS: 400.0000 mg | ORAL_TABLET | Freq: Three times a day (TID) | ORAL | Status: DC

## 2014-01-11 NOTE — Telephone Encounter (Signed)
Pt is having an outbreak now.  [name], Tamika L, RN

## 2014-02-01 ENCOUNTER — Telehealth: Payer: Self-pay | Admitting: *Deleted

## 2014-02-01 DIAGNOSIS — R21 Rash and other nonspecific skin eruption: Secondary | ICD-10-CM

## 2014-02-01 NOTE — Telephone Encounter (Signed)
Pt called stating she is requesting medication to take for herpes everyday to suppress the virus.  Medication was given only for outbreaks.  Pt requested to stay on acyclovir.  Please advise.  [name], [name], RN

## 2014-02-02 MED ORDER — ACYCLOVIR 400 MG PO TABS: 400.0000 mg | ORAL_TABLET | Freq: Two times a day (BID) | ORAL | Status: DC

## 2014-02-02 NOTE — Telephone Encounter (Signed)
Called patient about suppression therapy. She reports having several stressors in her life and also the diagnosis of herpes has left her anxious. Would like to be on suppression therapy.   Will send suppression therapy of acyclovir 400 mg BID, a reduction from three times daily (based on the 2015 Centers for Disease Control and Prevention guidelines). Will continue acyclovir since she has tolerated this medication.    Myra RudeJeremy E Schmitz, MD PGY-2, [facility] 02/02/2014, 1:07 PM

## 2014-03-02 ENCOUNTER — Telehealth: Payer: Self-pay | Admitting: Family Medicine

## 2014-03-02 DIAGNOSIS — Z1211 Encounter for screening for malignant neoplasm of colon: Secondary | ICD-10-CM

## 2014-03-02 NOTE — Telephone Encounter (Signed)
Needs a colonoscopy.  She needs a physical but could not schedule one until April. April's schedule is not available yet

## 2014-03-03 NOTE — Telephone Encounter (Signed)
Spoke with patient and informed her that with the insurance that she has she can go and make that appointment on her own. I suggested [place] to her

## 2014-03-04 ENCOUNTER — Encounter: Payer: Self-pay | Admitting: Gastroenterology

## 2014-03-15 ENCOUNTER — Other Ambulatory Visit: Payer: Self-pay | Admitting: *Deleted

## 2014-03-15 DIAGNOSIS — I10 Essential (primary) hypertension: Secondary | ICD-10-CM

## 2014-03-15 MED ORDER — HYDROCHLOROTHIAZIDE 25 MG PO TABS: ORAL_TABLET | ORAL | Status: DC

## 2014-03-23 ENCOUNTER — Encounter: Payer: Self-pay | Admitting: Gastroenterology

## 2014-04-22 ENCOUNTER — Ambulatory Visit (AMBULATORY_SURGERY_CENTER): Payer: Self-pay | Admitting: *Deleted

## 2014-04-22 VITALS — Ht 68.0 in | Wt 188.4 lb

## 2014-04-22 DIAGNOSIS — Z1211 Encounter for screening for malignant neoplasm of colon: Secondary | ICD-10-CM

## 2014-04-22 MED ORDER — NA SULFATE-K SULFATE-MG SULF 17.5-3.13-1.6 GM/177ML PO SOLN: ORAL | Status: DC

## 2014-04-22 NOTE — Progress Notes (Signed)
No allergies to eggs or soy. No prior anesthesia.  Pt given Emmi instructions for colonoscopy  No oxygen use  No diet drug use  

## 2014-04-29 ENCOUNTER — Telehealth: Payer: Self-pay | Admitting: Gastroenterology

## 2014-04-29 NOTE — Telephone Encounter (Signed)
A user error has taken place.

## 2014-05-06 ENCOUNTER — Encounter: Payer: Self-pay | Admitting: Gastroenterology

## 2014-05-06 ENCOUNTER — Encounter: Payer: No Typology Code available for payment source | Admitting: Gastroenterology

## 2014-06-07 ENCOUNTER — Other Ambulatory Visit: Payer: Self-pay | Admitting: Family Medicine

## 2014-06-07 DIAGNOSIS — G4700 Insomnia, unspecified: Secondary | ICD-10-CM

## 2014-06-07 DIAGNOSIS — R21 Rash and other nonspecific skin eruption: Secondary | ICD-10-CM

## 2014-06-09 MED ORDER — ACYCLOVIR 400 MG PO TABS: 400.0000 mg | ORAL_TABLET | Freq: Two times a day (BID) | ORAL | Status: DC

## 2014-06-09 NOTE — Telephone Encounter (Signed)
2nd request.  Martin, Tamika L, RN  

## 2014-06-09 NOTE — Telephone Encounter (Signed)
Spoke with patient about her refills. She takes the atarax for sleep and the acyclovir HSV infection. She had an outbreak recently. Will give her suppressive therapy.   Myra RudeJeremy E Shawnte Winton, MD PGY-2, [facility] 06/09/2014, 3:09 PM

## 2014-06-22 ENCOUNTER — Ambulatory Visit (AMBULATORY_SURGERY_CENTER): Payer: No Typology Code available for payment source | Admitting: Gastroenterology

## 2014-06-22 ENCOUNTER — Encounter: Payer: Self-pay | Admitting: Gastroenterology

## 2014-06-22 VITALS — BP 148/92 | HR 63 | Temp 97.4°F | Resp 17 | Ht 68.0 in | Wt 188.0 lb

## 2014-06-22 DIAGNOSIS — Z1211 Encounter for screening for malignant neoplasm of colon: Secondary | ICD-10-CM | POA: Diagnosis not present

## 2014-06-22 MED ORDER — SODIUM CHLORIDE 0.9 % IV SOLN: 500.0000 mL | INTRAVENOUS | Status: DC

## 2014-06-22 NOTE — Progress Notes (Signed)
To recovery, report given and VSS. 

## 2014-06-22 NOTE — Op Note (Signed)
[facility] [address] [place], [address]   COLONOSCOPY PROCEDURE REPORT  PATIENT: Wendy Mann, Wendy Mann  MR#: [id number] BIRTHDATE: [birth year] , 54  yrs. old GENDER: female ENDOSCOPIST: Louis Meckel, MD REFERRED BY: PROCEDURE DATE:  06/22/2014 PROCEDURE:   Colonoscopy, screening First Screening Colonoscopy - Avg.  risk and is 50 yrs.  old or older Yes.  Prior Negative Screening - Now for repeat screening. N/A  History of Adenoma - Now for follow-up colonoscopy & has been > or = to 3 yrs.  N/A  Polyps removed today? No Recommend repeat exam, <10 yrs? No ASA CLASS:   Class II INDICATIONS:Colorectal Neoplasm Risk Assessment for this procedure is average risk. MEDICATIONS: Monitored anesthesia care and Propofol 230 mg IV  DESCRIPTION OF PROCEDURE:   After the risks benefits and alternatives of the procedure were thoroughly explained, informed consent was obtained.  The digital rectal exam revealed no abnormalities of the rectum.   The LB [id number]  endoscope was introduced through the anus and advanced to the cecum, which was identified by both the appendix and ileocecal valve. No adverse events experienced.   The quality of the prep was (Suprep was used) excellent.  The instrument was then slowly withdrawn as the colon was fully examined. Estimated blood loss is zero unless otherwise noted in this procedure report.      COLON FINDINGS: A normal appearing cecum, ileocecal valve, and appendiceal orifice were identified.  The ascending, transverse, descending, sigmoid colon, and rectum appeared unremarkable. Retroflexed views revealed no abnormalities. The time to cecum = 4.4 Withdrawal time = 6.4   The scope was withdrawn and the procedure completed. COMPLICATIONS: There were no immediate complications.  ENDOSCOPIC IMPRESSION: Normal colonoscopy  RECOMMENDATIONS: Continue current colorectal screening recommendations for "routine risk"  patients with a repeat colonoscopy in 10 years.  eSigned:  Louis Meckel, MD 06/22/2014 3:13 PM   cc: Clare Gandy

## 2014-06-22 NOTE — Patient Instructions (Signed)
YOU HAD AN ENDOSCOPIC PROCEDURE TODAY AT THE [facility]:   Refer to the procedure report that was given to you for any specific questions about what was found during the examination.  If the procedure report does not answer your questions, please call your gastroenterologist to clarify.  If you requested that your care partner not be given the details of your procedure findings, then the procedure report has been included in a sealed envelope for you to review at your convenience later.  YOU SHOULD EXPECT: Some feelings of bloating in the abdomen. Passage of more gas than usual.  Walking can help get rid of the air that was put into your GI tract during the procedure and reduce the bloating. If you had a lower endoscopy (such as a colonoscopy or flexible sigmoidoscopy) you may notice spotting of blood in your stool or on the toilet paper. If you underwent a bowel prep for your procedure, you may not have a normal bowel movement for a few days.  Please Note:  You might notice some irritation and congestion in your nose or some drainage.  This is from the oxygen used during your procedure.  There is no need for concern and it should clear up in a day or so.  SYMPTOMS TO REPORT IMMEDIATELY:   Following lower endoscopy (colonoscopy or flexible sigmoidoscopy):  Excessive amounts of blood in the stool  Significant tenderness or worsening of abdominal pains  Swelling of the abdomen that is new, acute  Fever of 100F or higher   Following upper endoscopy (EGD)  Vomiting of blood or coffee ground material  New chest pain or pain under the shoulder blades  Painful or persistently difficult swallowing  New shortness of breath  Fever of 100F or higher  Black, tarry-looking stools  For urgent or emergent issues, a gastroenterologist can be reached at any hour by calling [phone].   DIET: Your first meal following the procedure should be a small meal and then it is ok to progress to  your normal diet. Heavy or fried foods are harder to digest and may make you feel nauseous or bloated.  Likewise, meals heavy in dairy and vegetables can increase bloating.  Drink plenty of fluids but you should avoid alcoholic beverages for 24 hours.  ACTIVITY:  You should plan to take it easy for the rest of today and you should NOT DRIVE or use heavy machinery until tomorrow (because of the sedation medicines used during the test).    FOLLOW UP: Our staff will call the number listed on your records the next business day following your procedure to check on you and address any questions or concerns that you may have regarding the information given to you following your procedure. If we do not reach you, we will leave a message.  However, if you are feeling well and you are not experiencing any problems, there is no need to return our call.  We will assume that you have returned to your regular daily activities without incident.  If any biopsies were taken you will be contacted by phone or by letter within the next 1-3 weeks.  Please call us at [phone] if you have not heard about the biopsies in 3 weeks.    SIGNATURES/CONFIDENTIALITY: You and/or your care partner have signed paperwork which will be entered into your electronic medical record.  These signatures attest to the fact that that the information above on your After Visit Summary has been reviewed   and is understood.  Full responsibility of the confidentiality of this discharge information lies with you and/or your care-partner. 

## 2014-06-23 ENCOUNTER — Telehealth: Payer: Self-pay | Admitting: *Deleted

## 2014-06-23 NOTE — Telephone Encounter (Signed)
  Follow up Call-  Call back number 06/22/2014  Post procedure Call Back phone  # [phone]  Permission to leave phone message Yes     Patient questions:  Do you have a fever, pain , or abdominal swelling? No. Pain Score  0 *  Have you tolerated food without any problems? Yes.    Have you been able to return to your normal activities? No.  Do you have any questions about your discharge instructions: Diet   No. Medications  No. Follow up visit  No.  Do you have questions or concerns about your Care? No.  Actions: * If pain score is 4 or above: No action needed, pain <4.

## 2014-07-21 ENCOUNTER — Other Ambulatory Visit: Payer: Self-pay

## 2014-07-21 DIAGNOSIS — Z1231 Encounter for screening mammogram for malignant neoplasm of breast: Secondary | ICD-10-CM

## 2014-07-27 ENCOUNTER — Ambulatory Visit
Admission: RE | Admit: 2014-07-27 | Discharge: 2014-07-27 | Disposition: A | Discharge status: Home / Self Care | Payer: No Typology Code available for payment source | Source: Ambulatory Visit

## 2014-07-27 DIAGNOSIS — Z1231 Encounter for screening mammogram for malignant neoplasm of breast: Secondary | ICD-10-CM

## 2014-08-11 ENCOUNTER — Ambulatory Visit (INDEPENDENT_AMBULATORY_CARE_PROVIDER_SITE_OTHER): Payer: No Typology Code available for payment source | Admitting: Family Medicine

## 2014-08-11 ENCOUNTER — Encounter: Payer: Self-pay | Admitting: Family Medicine

## 2014-08-11 VITALS — BP 128/71 | HR 80 | Temp 98.2°F | Wt 194.6 lb

## 2014-08-11 DIAGNOSIS — R070 Pain in throat: Secondary | ICD-10-CM | POA: Diagnosis not present

## 2014-08-11 DIAGNOSIS — J020 Streptococcal pharyngitis: Secondary | ICD-10-CM | POA: Diagnosis not present

## 2014-08-11 DIAGNOSIS — J029 Acute pharyngitis, unspecified: Secondary | ICD-10-CM

## 2014-08-11 LAB — POCT RAPID STREP A (OFFICE): RAPID STREP A SCREEN: NEGATIVE

## 2014-08-11 MED ORDER — CETIRIZINE HCL 10 MG PO CAPS: 10.0000 mg | ORAL_CAPSULE | Freq: Every day | ORAL | Status: DC

## 2014-08-11 NOTE — Progress Notes (Signed)
   Subjective:    Patient ID: Wendy Mann, female    DOB: 1960/01/28, 54 y.o.   MRN: [id number]  Seen for Same day visit for   CC: Throat pain  Her symptoms started last Thursday. She had a friend with similar problems in her problems have since resolved. The pain seems to be worse at night and resolved during the day. She is having some hoarseness as well as a cough that is production of green sputum. She denies any fevers or chills. She was to make sure that she feels better due to having to take care of her husband with multiple myeloma. The pain usually occurs in the back of her throat and having some dysphagia but also resolves during the day. She is able to eat and drink normally during the day when she is pain-free.     Review of Systems   See HPI for ROS. Objective:  BP 128/71 mmHg  Pulse 80  Temp(Src) 98.2 F (36.8 C) (Oral)  Wt 194 lb 9.6 oz (88.27 kg)  General: NAD HEENT: Some pain reproduced with palpation of the frontal sinus, tympanic membranes clear and intact bilaterally, no cervical lymphadenopathy, turbinates boggy bilaterally, no tonsillar exudates, uvula midline, no ulcers observed in the soft or hard palate. Cardiac: RRR, Respiratory: CTAB, normal effort. Skin: warm and dry, no rashes noted Neuro: alert      Assessment & Plan:  See Problem List Documentation

## 2014-08-11 NOTE — Patient Instructions (Signed)
Thank you for coming in,   I will try some Zyrtec for your runny nose and hopefully help improve your cough.  Most likely her symptoms are viral and just have to run their course.  If the symptoms don't get better after a couple weeks and please return and we need to do further workup.  Please bring all of your medications with you to each visit.    Please feel free to call with any questions or concerns at any time, at [phone]. --Dr. Jordan Likes

## 2014-08-11 NOTE — Assessment & Plan Note (Signed)
Most likely related to URI from postnasal drip. Positive for sick contacts with friend with similar symptoms Having some dysphonia and cough. Denies any fevers Lungs sound clear with no suggestion of pneumonia Rapid strep negative - Will try Zyrtec to help clear the postnasal drip  - If there is no improvement after 2 weeks of symptoms may need to consider ENT referral and laryngoscopy if dysphonia persists.

## 2014-09-08 ENCOUNTER — Other Ambulatory Visit: Payer: Self-pay | Admitting: *Deleted

## 2014-09-08 DIAGNOSIS — I10 Essential (primary) hypertension: Secondary | ICD-10-CM

## 2014-09-08 MED ORDER — HYDROCHLOROTHIAZIDE 25 MG PO TABS: ORAL_TABLET | ORAL | Status: DC

## 2014-10-06 ENCOUNTER — Encounter (HOSPITAL_COMMUNITY): Payer: Self-pay | Admitting: Neurology

## 2014-10-06 ENCOUNTER — Emergency Department (HOSPITAL_COMMUNITY): Payer: Worker's Compensation

## 2014-10-06 ENCOUNTER — Emergency Department (HOSPITAL_COMMUNITY)
Admission: EM | Admit: 2014-10-06 | Discharge: 2014-10-06 | Disposition: A | Discharge status: Home / Self Care | Payer: Worker's Compensation | Attending: Emergency Medicine | Admitting: Emergency Medicine

## 2014-10-06 DIAGNOSIS — Z880 Allergy status to penicillin: Secondary | ICD-10-CM | POA: Insufficient documentation

## 2014-10-06 DIAGNOSIS — S61317A Laceration without foreign body of left little finger with damage to nail, initial encounter: Secondary | ICD-10-CM | POA: Insufficient documentation

## 2014-10-06 DIAGNOSIS — Y990 Civilian activity done for income or pay: Secondary | ICD-10-CM | POA: Insufficient documentation

## 2014-10-06 DIAGNOSIS — F419 Anxiety disorder, unspecified: Secondary | ICD-10-CM | POA: Insufficient documentation

## 2014-10-06 DIAGNOSIS — Z79899 Other long term (current) drug therapy: Secondary | ICD-10-CM | POA: Diagnosis not present

## 2014-10-06 DIAGNOSIS — I10 Essential (primary) hypertension: Secondary | ICD-10-CM | POA: Insufficient documentation

## 2014-10-06 DIAGNOSIS — Y9289 Other specified places as the place of occurrence of the external cause: Secondary | ICD-10-CM | POA: Diagnosis not present

## 2014-10-06 DIAGNOSIS — Y9389 Activity, other specified: Secondary | ICD-10-CM | POA: Diagnosis not present

## 2014-10-06 DIAGNOSIS — W269XXA Contact with unspecified sharp object(s), initial encounter: Secondary | ICD-10-CM | POA: Diagnosis not present

## 2014-10-06 DIAGNOSIS — S6992XA Unspecified injury of left wrist, hand and finger(s), initial encounter: Secondary | ICD-10-CM | POA: Diagnosis present

## 2014-10-06 DIAGNOSIS — Z792 Long term (current) use of antibiotics: Secondary | ICD-10-CM | POA: Diagnosis not present

## 2014-10-06 DIAGNOSIS — S61219A Laceration without foreign body of unspecified finger without damage to nail, initial encounter: Secondary | ICD-10-CM

## 2014-10-06 DIAGNOSIS — Z8619 Personal history of other infectious and parasitic diseases: Secondary | ICD-10-CM | POA: Diagnosis not present

## 2014-10-06 HISTORY — DX: Herpesviral infection, unspecified: B00.9

## 2014-10-06 MED ORDER — GENTAMICIN SULFATE 0.1 % EX CREA: 1.0000 "application " | TOPICAL_CREAM | Freq: Three times a day (TID) | CUTANEOUS | Status: DC

## 2014-10-06 MED ORDER — OXYCODONE-ACETAMINOPHEN 5-325 MG PO TABS
1.0000 | ORAL_TABLET | Freq: Once | ORAL | Status: AC
  Administered 2014-10-06: 1 via ORAL
  Filled 2014-10-06: qty 1

## 2014-10-06 MED ORDER — CEPHALEXIN 500 MG PO CAPS: 500.0000 mg | ORAL_CAPSULE | Freq: Two times a day (BID) | ORAL | Status: DC

## 2014-10-06 MED ORDER — ONDANSETRON 4 MG PO TBDP
4.0000 mg | ORAL_TABLET | Freq: Once | ORAL | Status: AC
  Administered 2014-10-06: 4 mg via ORAL
  Filled 2014-10-06: qty 1

## 2014-10-06 MED ORDER — LIDOCAINE HCL 2 % IJ SOLN
20.0000 mL | Freq: Once | INTRAMUSCULAR | Status: AC
  Administered 2014-10-06: 400 mg
  Filled 2014-10-06: qty 20

## 2014-10-06 MED ORDER — LIDOCAINE-PRILOCAINE 2.5-2.5 % EX CREA
TOPICAL_CREAM | Freq: Once | CUTANEOUS | Status: AC
  Administered 2014-10-06: 1 via TOPICAL
  Filled 2014-10-06 (×2): qty 5

## 2014-10-06 MED ORDER — TRAMADOL HCL 50 MG PO TABS: 50.0000 mg | ORAL_TABLET | Freq: Four times a day (QID) | ORAL | Status: DC | PRN

## 2014-10-06 NOTE — ED Notes (Signed)
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 Movement is intact.

## 2014-10-06 NOTE — ED Notes (Signed)
PA at bedside, suturing laceration. Pt is a x 4.

## 2014-10-06 NOTE — ED Notes (Signed)
Notified phlebotomy of need for need urine drug screen for workman's comp.

## 2014-10-06 NOTE — ED Provider Notes (Signed)
CSN: [id number]     Arrival date & time 10/06/14  1042 History   First MD Initiated Contact with Patient 10/06/14 1105     Chief Complaint  Patient presents with  . Extremity Laceration     (Consider location/radiation/quality/duration/timing/severity/associated sxs/prior Treatment) HPI  PCP: Clare Gandy, MD PMH: Hypertension, history of a cane abuse, herpes simplex type II  Wendy Mann is a 54 y.o.  female  Patient works at [employer] and while cutting lettuce accidentally cut the left lateral side of her hand including part of the nail. The incident happened at 9:30 this morning, she tried going th three Urgent Cares before coming to the ER. Her pain is now a 8/10 and throbbing. She denies any other injuries or hx of bleeding disorders.   Past Medical History  Diagnosis Date  . Hypertension   . History of cocaine use 2000    Attends narcotic anonymous   . Herpes simplex type 2 infection    Past Surgical History  Procedure Laterality Date  . No past surgeries     Family History  Problem Relation Age of Onset  . Stroke Mother   . Hypertension Mother   . Stroke Sister   . Heart disease Sister   . Colon cancer Neg Hx    Social History  Substance Use Topics  . Smoking status: Former Games developer  . Smokeless tobacco: Never Used  . Alcohol Use: No   OB History    No data available     Review of Systems  Constitutional: Negative for diaphoresis.  HENT: Negative for sore throat.   Cardiovascular: Negative for chest pain.  Gastrointestinal: Negative for vomiting.  Skin: Positive for wound.  Neurological: Negative for numbness.  Psychiatric/Behavioral: The patient is nervous/anxious.     10 Systems reviewed and are negative for acute change except as noted in the HPI.     Allergies  Penicillins  Home Medications   Prior to Admission medications   Medication Sig Start Date End Date Taking? Authorizing Provider  acyclovir (ZOVIRAX) 400 MG tablet Take  1 tablet (400 mg total) by mouth 2 (two) times daily. 06/09/14   Myra Rude, MD  Cetirizine HCl 10 MG CAPS Take 1 capsule (10 mg total) by mouth daily. 08/11/14   Myra Rude, MD  gentamicin cream (GARAMYCIN) 0.1 % Apply 1 application topically 3 (three) times daily. 10/06/14   Bailynn Dyk Neva Seat, PA-C  hydrochlorothiazide (HYDRODIURIL) 25 MG tablet TAKE 1 TABLET (25 MG TOTAL) BY MOUTH DAILY. 09/08/14   Myra Rude, MD  hydrOXYzine (ATARAX/VISTARIL) 50 MG tablet TAKE 1 TABLET (50 MG TOTAL) BY MOUTH 3 (THREE) TIMES DAILY AS NEEDED. 06/09/14   Myra Rude, MD  Lysine 500 MG CAPS Take by mouth 2 (two) times daily.    Historical Provider, MD  traMADol (ULTRAM) 50 MG tablet Take 1 tablet (50 mg total) by mouth every 6 (six) hours as needed. 10/06/14   Christophe Rising Neva Seat, PA-C   BP 155/91 mmHg  Pulse 64  Temp(Src) 97.9 F (36.6 C) (Oral)  Resp 20  SpO2 100% Physical Exam  Constitutional: She appears well-developed and well-nourished. No distress.  HENT:  Head: Normocephalic and atraumatic.  Eyes: Pupils are equal, round, and reactive to light.  Neck: Normal range of motion. Neck supple.  Cardiovascular: Normal rate and regular rhythm.   Pulmonary/Chest: Effort normal.  Abdominal: Soft.  Neurological: She is alert.  Skin: Skin is warm and dry.  Avulsion laceration to the entire  lateral side left 5th finger. Approximately 1/4 of the nailbed has been avulsed and is missing. Patient has FROM of finger to all joints. CR < 2 seconds.  Nursing note and vitals reviewed.   ED Course  Procedures (including critical care time) Labs Review Labs Reviewed - No data to display  Imaging Review Dg Hand Complete Left  10/06/2014   CLINICAL DATA:  54 year old who sustained a laceration to the left small finger earlier today.  EXAM: LEFT HAND - COMPLETE 3+ VIEW  COMPARISON:  None.  FINDINGS: Examination was performed with overlying bandage material. Soft tissue injury involving the base of the small  finger. No evidence of acute fracture. No opaque foreign bodies in the soft tissues. Mild narrowing of IP joint spaces involving all of the fingers. Mild to moderate narrowing of the trapezium-1st metacarpal joint in the wrist. Small bone island incidentally noted in the distal radius.  IMPRESSION: 1. No acute osseous abnormality. 2. No opaque foreign bodies in the soft tissues. 3. 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   Electronically Signed   By: Hulan Saas M.D.   On: 10/06/2014 12:08   I have personally reviewed and evaluated these images and lab results as part of my medical decision-making.   EKG Interpretation None      MDM   Final diagnoses:  Finger laceration, initial encounter    LACERATION REPAIR Performed by: Dorthula Matas Authorized by: Dorthula Matas Consent: Verbal consent obtained. Risks and benefits: risks, benefits and alternatives were discussed Consent given by: patient Patient identity confirmed: provided demographic data Prepped and Draped in normal sterile fashion Wound explored  Laceration Location: left lateral finger  Laceration Length: 10 cm  No Foreign Bodies seen or palpated  Anesthesia: local infiltration  Local anesthetic: lidocaine 2% wo epinephrine and EMAL  Anesthetic total: 4 ml  Irrigation method: syringe Amount of cleaning: standard  Skin closure: sutures, chromic gut -dissolvable sutures  Number of sutures: 25  Technique: simple interrupted and running  Patient tolerance: Patient tolerated the procedure well with no immediate complications.  Dr. Littie Deeds saw the patient as well and agrees with plan for dissolvable sutures. Pt given f/u with Hand Dr. Melvyn Novas and I explained why it is important for follow-up. Wound edges approximated but the flap of avulsed skin may not revascularize and become necrotic and fall  off. Edge of patients nail is exposed and matrix removed-- will likely not grow back. Pt made aware. Rx: Keflex, tramadol and Gentamycin cream.  Medications  lidocaine-prilocaine (EMLA) cream (1 application Topical Given 10/06/14 1210)  lidocaine (XYLOCAINE) 2 % (with pres) injection 400 mg (400 mg Other Given 10/06/14 1330)  oxyCODONE-acetaminophen (PERCOCET/ROXICET) 5-325 MG per tablet 1 tablet (1 tablet Oral Given 10/06/14 1128)  ondansetron (ZOFRAN-ODT) disintegrating tablet 4 mg (4 mg Oral Given 10/06/14 1128)    54 y.o.Kimari Lienhard No's medical screening exam was performed and I feel the patient has had an appropriate workup for their chief complaint at this time and likelihood of emergent condition existing is low. They have been counseled on decision, discharge, follow up and which symptoms necessitate immediate return to the emergency department. They or their family verbally stated understanding and agreement with plan and discharged in stable condition.   Vital signs are stable at discharge. Filed Vitals:   10/06/14 1215  BP: 155/91  Pulse: 64  Temp:   Resp:  Marlon Pel, PA-C 10/06/14 1429  Mirian Mo, MD 10/06/14 [phone]

## 2014-10-06 NOTE — ED Provider Notes (Signed)
Medical screening examination/treatment/procedure(s) were conducted as a shared visit with non-physician practitioner(s) and myself.  I personally evaluated the patient during the encounter.   EKG Interpretation None       Briefly, pt is a 54 y.o. female presenting with laceration of L ulnar aspect of hand from slicer.  I performed an examination on the patient including cardiac, pulmonary, and gi systems which were unremarkable.  Laceration repaired by PA.  DC home in stable condition.  1. Finger laceration, initial encounter    .     Mirian Mo, MD 10/06/14 [phone]

## 2014-10-06 NOTE — Discharge Instructions (Signed)
Laceration Care, Adult °A laceration is a cut that goes through all of the layers of the skin and into the tissue that is right under the skin. Some lacerations heal on their own. Others need to be closed with stitches (sutures), staples, skin adhesive strips, or skin glue. Proper laceration care minimizes the risk of infection and helps the laceration to heal better. °HOW TO CARE FOR YOUR LACERATION °If sutures or staples were used: °· Keep the wound clean and dry. °· If you were given a bandage (dressing), you should change it at least one time per day or as told by your health care provider. You should also change it if it becomes wet or dirty. °· Keep the wound completely dry for the first 24 hours or as told by your health care provider. After that time, you may shower or bathe. However, make sure that the wound is not soaked in water until after the sutures or staples have been removed. °· Clean the wound one time each day or as told by your health care provider: °¨ Wash the wound with soap and water. °¨ Rinse the wound with water to remove all soap. °¨ Pat the wound dry with a clean towel. Do not rub the wound. °· After cleaning the wound, apply a thin layer of antibiotic ointment as told by your health care provider. This will help to prevent infection and keep the dressing from sticking to the wound. °· Have the sutures or staples removed as told by your health care provider. °If skin adhesive strips were used: °· Keep the wound clean and dry. °· If you were given a bandage (dressing), you should change it at least one time per day or as told by your health care provider. You should also change it if it becomes dirty or wet. °· Do not get the skin adhesive strips wet. You may shower or bathe, but be careful to keep the wound dry. °· If the wound gets wet, pat it dry with a clean towel. Do not rub the wound. °· Skin adhesive strips fall off on their own. You may trim the strips as the wound heals. Do not  remove skin adhesive strips that are still stuck to the wound. They will fall off in time. °If skin glue was used: °· Try to keep the wound dry, but you may briefly wet it in the shower or bath. Do not soak the wound in water, such as by swimming. °· After you have showered or bathed, gently pat the wound dry with a clean towel. Do not rub the wound. °· Do not do any activities that will make you sweat heavily until the skin glue has fallen off on its own. °· Do not apply liquid, cream, or ointment medicine to the wound while the skin glue is in place. Using those may loosen the film before the wound has healed. °· If you were given a bandage (dressing), you should change it at least one time per day or as told by your health care provider. You should also change it if it becomes dirty or wet. °· If a dressing is placed over the wound, be careful not to apply tape directly over the skin glue. Doing that may cause the glue to be pulled off before the wound has healed. °· Do not pick at the glue. The skin glue usually remains in place for 5-10 days, then it falls off of the skin. °General Instructions °· Take over-the-counter and prescription   medicines only as told by your health care provider. °· If you were prescribed an antibiotic medicine or ointment, take or apply it as told by your doctor. Do not stop using it even if your condition improves. °· To help prevent scarring, make sure to cover your wound with sunscreen whenever you are outside after stitches are removed, after adhesive strips are removed, or when glue remains in place and the wound is healed. Make sure to wear a sunscreen of at least 30 SPF. °· Do not scratch or pick at the wound. °· Keep all follow-up visits as told by your health care provider. This is important. °· Check your wound every day for signs of infection. Watch for: °· Redness, swelling, or pain. °· Fluid, blood, or pus. °· Raise (elevate) the injured area above the level of your heart  while you are sitting or lying down, if possible. °SEEK MEDICAL CARE IF: °· You received a tetanus shot and you have swelling, severe pain, redness, or bleeding at the injection site. °· You have a fever. °· A wound that was closed breaks open. °· You notice a bad smell coming from your wound or your dressing. °· You notice something coming out of the wound, such as wood or glass. °· Your pain is not controlled with medicine. °· You have increased redness, swelling, or pain at the site of your wound. °· You have fluid, blood, or pus coming from your wound. °· You notice a change in the color of your skin near your wound. °· You need to change the dressing frequently due to fluid, blood, or pus draining from the wound. °· You develop a new rash. °· You develop numbness around the wound. °SEEK IMMEDIATE MEDICAL CARE IF: °· You develop severe swelling around the wound. °· Your pain suddenly increases and is severe. °· You develop painful lumps near the wound or on skin that is anywhere on your body. °· You have a red streak going away from your wound. °· The wound is on your hand or foot and you cannot properly move a finger or toe. °· The wound is on your hand or foot and you notice that your fingers or toes look pale or bluish. °  °This information is not intended to replace advice given to you by your health care provider. Make sure you discuss any questions you have with your health care provider. °  °Document Released: 12/18/2004 Document Revised: 05/04/2014 Document Reviewed: 12/14/2013 °Elsevier Interactive Patient Education ©2016 [employer] ° °Stitches, Staples, or Adhesive Wound Closure °Health care providers use stitches (sutures), staples, and certain glue (skin adhesives) to hold skin together while it heals (wound closure). You may need this treatment after you have surgery or if you cut your skin accidentally. These methods help your skin to heal more quickly and make it less likely that you will have  a scar. A wound may take several months to heal completely. °The type of wound you have determines when your wound gets closed. In most cases, the wound is closed as soon as possible (primary skin closure). Sometimes, closure is delayed so the wound can be cleaned and allowed to heal naturally. This reduces the chance of infection. Delayed closure may be needed if your wound: °· Is caused by a bite. °· Happened more than 6 hours ago. °· Involves loss of skin or the tissues under the skin. °· Has dirt or debris in it that cannot be removed. °· Is infected. °WHAT   ARE THE DIFFERENT KINDS OF WOUND CLOSURES? °There are many options for wound closure. The one that your health care provider uses depends on how deep and how large your wound is. °Adhesive Glue °To use this type of glue to close a wound, your health care provider holds the edges of the wound together and paints the glue on the surface of your skin. You may need more than one layer of glue. Then the wound may be covered with a light bandage (dressing). °This type of skin closure may be used for small wounds that are not deep (superficial). Using glue for wound closure is less painful than other methods. It does not require a medicine that numbs the area (local anesthetic). This method also leaves nothing to be removed. Adhesive glue is often used for children and on facial wounds. °Adhesive glue cannot be used for wounds that are deep, uneven, or bleeding. It is not used inside of a wound.  °Adhesive Strips °These strips are made of sticky (adhesive), porous paper. They are applied across your skin edges like a regular adhesive bandage. You leave them on until they fall off. °Adhesive strips may be used to close very superficial wounds. They may also be used along with sutures to improve the closure of your skin edges.  °Sutures °Sutures are the oldest method of wound closure. Sutures can be made from natural substances, such as silk, or from synthetic  materials, such as nylon and steel. They can be made from a material that your body can break down as your wound heals (absorbable), or they can be made from a material that needs to be removed from your skin (nonabsorbable). They come in many different strengths and sizes. °Your health care provider attaches the sutures to a steel needle on one end. Sutures can be passed through your skin, or through the tissues beneath your skin. Then they are tied and cut. Your skin edges may be closed in one continuous stitch or in separate stitches. °Sutures are strong and can be used for all kinds of wounds. Absorbable sutures may be used to close tissues under the skin. The disadvantage of sutures is that they may cause skin reactions that lead to infection. Nonabsorbable sutures need to be removed. °Staples °When surgical staples are used to close a wound, the edges of your skin on both sides of the wound are brought close together. A staple is placed across the wound, and an instrument secures the edges together. Staples are often used to close surgical cuts (incisions). °Staples are faster to use than sutures, and they cause less skin reaction. Staples need to be removed using a tool that bends the staples away from your skin. °HOW DO I CARE FOR MY WOUND CLOSURE? °· Take medicines only as directed by your health care provider. °· If you were prescribed an antibiotic medicine for your wound, finish it all even if you start to feel better. °· Use ointments or creams only as directed by your health care provider. °· Wash your hands with soap and water before and after touching your wound. °· Do not soak your wound in water. Do not take baths, swim, or use a hot tub until your health care provider approves. °· Ask your health care provider when you can start showering. Cover your wound if directed by your health care provider. °· Do not take out your own sutures or staples. °· Do not pick at your wound. Picking can cause an  infection. °·   Keep all follow-up visits as directed by your health care provider. This is important. HOW LONG WILL I HAVE MY WOUND CLOSURE?  Leave adhesive glue on your skin until the glue peels away.  Leave adhesive strips on your skin until the strips fall off.  Absorbable sutures will dissolve within several days.  Nonabsorbable sutures and staples must be removed. The location of the wound will determine how long they stay in. This can range from several days to a couple of weeks. WHEN SHOULD I SEEK HELP FOR MY WOUND CLOSURE? Contact your health care provider if:  You have a fever.  You have chills.  You have drainage, redness, swelling, or pain at your wound.  There is a bad smell coming from your wound.  The skin edges of your wound start to separate after your sutures have been removed.  Your wound becomes thick, raised, and darker in color after your sutures come out (scarring).   This information is not intended to replace advice given to you by your health care provider. Make sure you discuss any questions you have with your health care provider.   Document Released: 09/12/2000 Document Revised: 01/08/2014 Document Reviewed: 05/27/2013 Elsevier Interactive Patient Education 2016 [employer]  Sutured Wound Care Sutures are stitches that can be used to close wounds. Taking care of your wound properly can help to prevent pain and infection. It can also help your wound to heal more quickly. HOW TO CARE FOR YOUR SUTURED WOUND Wound Care  Keep the wound clean and dry.  If you were given a bandage (dressing), you should change it at least once per day or as directed by your health care provider. You should also change it if it becomes wet or dirty.  Keep the wound completely dry for the first 24 hours or as directed by your health care provider. After that time, you may shower or bathe. However, make sure that the wound is not soaked in water until the sutures have been  removed.  Clean the wound one time each day or as directed by your health care provider.  Wash the wound with soap and water.  Rinse the wound with water to remove all soap.  Pat the wound dry with a clean towel. Do not rub the wound.  Aftercleaning the wound, apply a thin layer of antibioticointment as directed by your health care provider. This will help to prevent infection and keep the dressing from sticking to the wound.  Have the sutures removed as directed by your health care provider. General Instructions  Take or apply medicines only as directed by your health care provider.  To help prevent scarring, make sure to cover your wound with sunscreen whenever you are outside after the sutures are removed and the wound is healed. Make sure to wear a sunscreen of at least 30 SPF.  If you were prescribed an antibiotic medicine or ointment, finish all of it even if you start to feel better.  Do not scratch or pick at the wound.  Keep all follow-up visits as directed by your health care provider. This is important.  Check your wound every day for signs of infection. Watch for:   Redness, swelling, or pain.  Fluid, blood, or pus.  Raise (elevate) the injured area above the level of your heart while you are sitting or lying down, if possible.  Avoid stretching your wound.  Drink enough fluids to keep your urine clear or pale yellow. SEEK MEDICAL CARE IF:  You received a tetanus shot and you have swelling, severe pain, redness, or bleeding at the injection site.  You have a fever.  A wound that was closed breaks open.  You notice a bad smell coming from the wound.  You notice something coming out of the wound, such as wood or glass.  Your pain is not controlled with medicine.  You have increased redness, swelling, or pain at the site of your wound.  You have fluid, blood, or pus coming from your wound.  You notice a change in the color of your skin near your  wound.  You need to change the dressing frequently due to fluid, blood, or pus draining from the wound.  You develop a new rash.  You develop numbness around the wound. SEEK IMMEDIATE MEDICAL CARE IF:  You develop severe swelling around the injury site.  Your pain suddenly increases and is severe.  You develop painful lumps near the wound or on skin that is anywhere on your body.  You have a red streak going away from your wound.  The wound is on your hand or foot and you cannot properly move a finger or toe.  The wound is on your hand or foot and you notice that your fingers or toes look pale or bluish.   This information is not intended to replace advice given to you by your health care provider. Make sure you discuss any questions you have with your health care provider.   Document Released: 01/26/2004 Document Revised: 05/04/2014 Document Reviewed: 07/30/2012 Elsevier Interactive Patient Education [employer]

## 2014-10-11 ENCOUNTER — Ambulatory Visit (INDEPENDENT_AMBULATORY_CARE_PROVIDER_SITE_OTHER): Payer: No Typology Code available for payment source | Admitting: Family Medicine

## 2014-10-11 ENCOUNTER — Encounter: Payer: Self-pay | Admitting: Family Medicine

## 2014-10-11 VITALS — BP 147/86 | HR 67 | Temp 98.0°F | Wt 192.0 lb

## 2014-10-11 DIAGNOSIS — R7303 Prediabetes: Secondary | ICD-10-CM

## 2014-10-11 DIAGNOSIS — R32 Unspecified urinary incontinence: Secondary | ICD-10-CM | POA: Insufficient documentation

## 2014-10-11 DIAGNOSIS — Z1159 Encounter for screening for other viral diseases: Secondary | ICD-10-CM

## 2014-10-11 DIAGNOSIS — Z114 Encounter for screening for human immunodeficiency virus [HIV]: Secondary | ICD-10-CM

## 2014-10-11 DIAGNOSIS — Z0000 Encounter for general adult medical examination without abnormal findings: Secondary | ICD-10-CM | POA: Diagnosis not present

## 2014-10-11 DIAGNOSIS — Z7184 Encounter for health counseling related to travel: Secondary | ICD-10-CM | POA: Insufficient documentation

## 2014-10-11 LAB — CBC
HCT: 35.9 % — ABNORMAL LOW (ref 36.0–46.0)
Hemoglobin: 12 g/dL (ref 12.0–15.0)
MCH: 29.6 pg (ref 26.0–34.0)
MCHC: 33.4 g/dL (ref 30.0–36.0)
MCV: 88.6 fL (ref 78.0–100.0)
MPV: 10.2 fL (ref 8.6–12.4)
PLATELETS: 304 10*3/uL (ref 150–400)
RBC: 4.05 MIL/uL (ref 3.87–5.11)
RDW: 13.6 % (ref 11.5–15.5)
WBC: 3.7 10*3/uL — AB (ref 4.0–10.5)

## 2014-10-11 LAB — COMPREHENSIVE METABOLIC PANEL
ALBUMIN: 4.4 g/dL (ref 3.6–5.1)
ALT: 25 U/L (ref 6–29)
AST: 16 U/L (ref 10–35)
Alkaline Phosphatase: 67 U/L (ref 33–130)
BILIRUBIN TOTAL: 0.5 mg/dL (ref 0.2–1.2)
BUN: 11 mg/dL (ref 7–25)
CO2: 25 mmol/L (ref 20–31)
CREATININE: 0.7 mg/dL (ref 0.50–1.05)
Calcium: 9.9 mg/dL (ref 8.6–10.4)
Chloride: 104 mmol/L (ref 98–110)
Glucose, Bld: 97 mg/dL (ref 65–99)
Potassium: 3.3 mmol/L — ABNORMAL LOW (ref 3.5–5.3)
SODIUM: 141 mmol/L (ref 135–146)
TOTAL PROTEIN: 7.3 g/dL (ref 6.1–8.1)

## 2014-10-11 LAB — LIPID PANEL
Cholesterol: 159 mg/dL (ref 125–200)
HDL: 59 mg/dL (ref >=46)
LDL Cholesterol: 89 mg/dL (ref <130)
Total CHOL/HDL Ratio: 2.7 Ratio (ref <=5.0)
Triglycerides: 55 mg/dL (ref <150)
VLDL: 11 mg/dL (ref <30)

## 2014-10-11 LAB — POCT GLYCOSYLATED HEMOGLOBIN (HGB A1C): Hemoglobin A1C: 6
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
## 2014-10-11 NOTE — Patient Instructions (Signed)
Thank you for coming in,   I will call you or send a letter with the results from today.   Please bring all of your medications with you to each visit.   Sign up for My Chart to have easy access to your labs results, and communication with your Primary care physician   Please feel free to call with any questions or concerns at any time, at [phone]. --Dr. Jordan Likes  Kegel Exercises The goal of Kegel exercises is to isolate and exercise your pelvic floor muscles. These muscles act as a hammock that supports the rectum, vagina, small intestine, and uterus. As the muscles weaken, the hammock sags and these organs are displaced from their normal positions. Kegel exercises can strengthen your pelvic floor muscles and help you to improve bladder and bowel control, improve sexual response, and help reduce many problems and some discomfort during pregnancy. Kegel exercises can be done anywhere and at any time. HOW TO PERFORM KEGEL EXERCISES 1. Locate your pelvic floor muscles. To do this, squeeze (contract) the muscles that you use when you try to stop the flow of urine. You will feel a tightness in the vaginal area (women) and a tight lift in the rectal area (men and women). 2. When you begin, contract your pelvic muscles tight for 2-5 seconds, then relax them for 2-5 seconds. This is one set. Do 4-5 sets with a short pause in between. 3. Contract your pelvic muscles for 8-10 seconds, then relax them for 8-10 seconds. Do 4-5 sets. If you cannot contract your pelvic muscles for 8-10 seconds, try 5-7 seconds and work your way up to 8-10 seconds. Your goal is 4-5 sets of 10 contractions each day. Keep your stomach, buttocks, and legs relaxed during the exercises. Perform sets of both short and long contractions. Vary your positions. Perform these contractions 3-4 times per day. Perform sets while you are:   Lying in bed in the morning.  Standing at lunch.  Sitting in the late afternoon.  Lying in bed  at night. You should do 40-50 contractions per day. Do not perform more Kegel exercises per day than recommended. Overexercising can cause muscle fatigue. Continue these exercises for for at least 15-20 weeks or as directed by your caregiver.   This information is not intended to replace advice given to you by your health care provider. Make sure you discuss any questions you have with your health care provider.   Document Released: 12/05/2011 Document Revised: 01/08/2014 Document Reviewed: 12/05/2011 Elsevier Interactive Patient Education [employer]
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
## 2014-10-11 NOTE — Assessment & Plan Note (Signed)
Most likely stress incontinence in nature. Has been occurring for the past 3 months and intermittent in nature - Given information about Kegel exercises - If no improvement then may need to refer to monitor urology for physical therapy consideration or gynecology

## 2014-10-12 LAB — HIV ANTIBODY (ROUTINE TESTING W REFLEX): HIV 1&2 Ab, 4th Generation: NONREACTIVE

## 2014-10-12 LAB — HEPATITIS C ANTIBODY: HCV AB: NEGATIVE

## 2014-10-12 LAB — TSH: TSH: 1.89 u[IU]/mL (ref 0.350–4.500)

## 2014-10-18 ENCOUNTER — Encounter: Payer: Self-pay | Admitting: Family Medicine

## 2014-10-27 ENCOUNTER — Ambulatory Visit (INDEPENDENT_AMBULATORY_CARE_PROVIDER_SITE_OTHER): Payer: No Typology Code available for payment source | Admitting: *Deleted

## 2014-10-27 DIAGNOSIS — Z23 Encounter for immunization: Secondary | ICD-10-CM

## 2014-12-09 ENCOUNTER — Other Ambulatory Visit: Payer: Self-pay | Admitting: *Deleted

## 2014-12-09 DIAGNOSIS — R21 Rash and other nonspecific skin eruption: Secondary | ICD-10-CM

## 2014-12-09 MED ORDER — ACYCLOVIR 400 MG PO TABS: 400.0000 mg | ORAL_TABLET | Freq: Two times a day (BID) | ORAL | Status: DC

## 2015-01-28 ENCOUNTER — Telehealth: Payer: Self-pay | Admitting: Family Medicine

## 2015-01-28 DIAGNOSIS — I10 Essential (primary) hypertension: Secondary | ICD-10-CM

## 2015-01-28 DIAGNOSIS — R21 Rash and other nonspecific skin eruption: Secondary | ICD-10-CM

## 2015-01-28 DIAGNOSIS — G4700 Insomnia, unspecified: Secondary | ICD-10-CM

## 2015-01-28 NOTE — Telephone Encounter (Signed)
No longer has insurance.  She would like to get her # RX refilled for 6 months without having to come to the dr

## 2015-01-28 NOTE — Telephone Encounter (Signed)
Please leave a message if pt doesn't answer

## 2015-02-01 MED ORDER — HYDROXYZINE HCL 50 MG PO TABS: ORAL_TABLET | ORAL | Status: DC

## 2015-02-01 MED ORDER — ACYCLOVIR 400 MG PO TABS: 400.0000 mg | ORAL_TABLET | Freq: Two times a day (BID) | ORAL | Status: DC

## 2015-02-01 MED ORDER — HYDROCHLOROTHIAZIDE 25 MG PO TABS
ORAL_TABLET | ORAL | Status: DC
Start: 2015-02-01 — End: 2015-07-12

## 2015-02-01 NOTE — Telephone Encounter (Signed)
Spoke with patient and she is not able to afford her insurance. Advised that she try to obtain the orange card. Medications sent in.   Myra Rude, MD PGY-3, [facility] 02/01/2015, 8:44 AM

## 2015-07-10 ENCOUNTER — Emergency Department (HOSPITAL_COMMUNITY): Payer: No Typology Code available for payment source

## 2015-07-10 ENCOUNTER — Emergency Department (HOSPITAL_COMMUNITY)
Admission: EM | Admit: 2015-07-10 | Discharge: 2015-07-10 | Disposition: A | Discharge status: Home / Self Care | Payer: No Typology Code available for payment source | Attending: Emergency Medicine | Admitting: Emergency Medicine

## 2015-07-10 ENCOUNTER — Encounter (HOSPITAL_COMMUNITY): Payer: Self-pay | Admitting: Emergency Medicine

## 2015-07-10 DIAGNOSIS — M542 Cervicalgia: Secondary | ICD-10-CM | POA: Diagnosis not present

## 2015-07-10 DIAGNOSIS — M25512 Pain in left shoulder: Secondary | ICD-10-CM | POA: Insufficient documentation

## 2015-07-10 DIAGNOSIS — I10 Essential (primary) hypertension: Secondary | ICD-10-CM | POA: Insufficient documentation

## 2015-07-10 DIAGNOSIS — Y999 Unspecified external cause status: Secondary | ICD-10-CM | POA: Insufficient documentation

## 2015-07-10 DIAGNOSIS — M25561 Pain in right knee: Secondary | ICD-10-CM | POA: Diagnosis not present

## 2015-07-10 DIAGNOSIS — R079 Chest pain, unspecified: Secondary | ICD-10-CM | POA: Diagnosis not present

## 2015-07-10 DIAGNOSIS — R51 Headache: Secondary | ICD-10-CM | POA: Diagnosis not present

## 2015-07-10 DIAGNOSIS — Y92410 Unspecified street and highway as the place of occurrence of the external cause: Secondary | ICD-10-CM | POA: Insufficient documentation

## 2015-07-10 DIAGNOSIS — Y939 Activity, unspecified: Secondary | ICD-10-CM | POA: Insufficient documentation

## 2015-07-10 DIAGNOSIS — Z79899 Other long term (current) drug therapy: Secondary | ICD-10-CM | POA: Insufficient documentation

## 2015-07-10 MED ORDER — IBUPROFEN 800 MG PO TABS: 800.0000 mg | ORAL_TABLET | Freq: Three times a day (TID) | ORAL | Status: DC

## 2015-07-10 MED ORDER — METHOCARBAMOL 500 MG PO TABS: 500.0000 mg | ORAL_TABLET | Freq: Two times a day (BID) | ORAL | Status: DC

## 2015-07-10 MED ORDER — IBUPROFEN 400 MG PO TABS
600.0000 mg | ORAL_TABLET | Freq: Once | ORAL | Status: AC
  Administered 2015-07-10: 600 mg via ORAL
  Filled 2015-07-10: qty 1
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
## 2015-07-12 ENCOUNTER — Other Ambulatory Visit: Payer: Self-pay | Admitting: *Deleted

## 2015-07-12 DIAGNOSIS — I10 Essential (primary) hypertension: Secondary | ICD-10-CM

## 2015-07-13 ENCOUNTER — Ambulatory Visit (INDEPENDENT_AMBULATORY_CARE_PROVIDER_SITE_OTHER): Payer: No Typology Code available for payment source | Admitting: Family Medicine

## 2015-07-13 VITALS — BP 146/77 | HR 68 | Temp 98.1°F | Wt 206.8 lb

## 2015-07-13 DIAGNOSIS — M62838 Other muscle spasm: Secondary | ICD-10-CM

## 2015-07-13 DIAGNOSIS — M6249 Contracture of muscle, multiple sites: Secondary | ICD-10-CM

## 2015-07-13 DIAGNOSIS — M25512 Pain in left shoulder: Secondary | ICD-10-CM

## 2015-07-13 MED ORDER — METHOCARBAMOL 500 MG PO TABS: 500.0000 mg | ORAL_TABLET | Freq: Two times a day (BID) | ORAL | Status: DC
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
## 2015-07-13 NOTE — Patient Instructions (Signed)
Perform the exercises provided several times daily.  I recommend that you continue taking the medications as prescribed. I have sent in a few extra days to use if needed.  If your symptoms are not improving by Friday, consider [facility].  The clinic starts at 5:30pm.   Cervical Sprain A cervical sprain is an injury in the neck in which the strong, fibrous tissues (ligaments) that connect your neck bones stretch or tear. Cervical sprains can range from mild to severe. Severe cervical sprains can cause the neck vertebrae to be unstable. This can lead to damage of the spinal cord and can result in serious nervous system problems. The amount of time it takes for a cervical sprain to get better depends on the cause and extent of the injury. Most cervical sprains heal in 1 to 3 weeks. CAUSES  Severe cervical sprains may be caused by:   Contact sport injuries (such as from football, rugby, wrestling, hockey, auto racing, gymnastics, diving, martial arts, or boxing).   Motor vehicle collisions.   Whiplash injuries. This is an injury from a sudden forward and backward whipping movement of the head and neck.  Falls.  Mild cervical sprains may be caused by:   Being in an awkward position, such as while cradling a telephone between your ear and shoulder.   Sitting in a chair that does not offer proper support.   Working at a poorly Marketing executivedesigned computer station.   Looking up or down for long periods of time.  SYMPTOMS   Pain, soreness, stiffness, or a burning sensation in the front, back, or sides of the neck. This discomfort may develop immediately after the injury or slowly, 24 hours or more after the injury.   Pain or tenderness directly in the middle of the back of the neck.   Shoulder or upper back pain.   Limited ability to move the neck.   Headache.   Dizziness.   Weakness, numbness, or tingling in the hands or arms.    Muscle spasms.   Difficulty swallowing or chewing.   Tenderness and swelling of the neck.  DIAGNOSIS  Most of the time your health care provider can diagnose a cervical sprain by taking your history and doing a physical exam. Your health care provider will ask about previous neck injuries and any known neck problems, such as arthritis in the neck. X-rays may be taken to find out if there are any other problems, such as with the bones of the neck. Other tests, such as a CT scan or MRI, may also be needed.  TREATMENT  Treatment depends on the severity of the cervical sprain. Mild sprains can be treated with rest, keeping the neck in place (immobilization), and pain medicines. Severe cervical sprains are immediately immobilized. Further treatment is done to help with pain, muscle spasms, and other symptoms and may include:  Medicines, such as pain relievers, numbing medicines, or muscle relaxants.   Physical therapy. This may involve stretching exercises, strengthening exercises, and posture training. Exercises and improved posture can help stabilize the neck, strengthen muscles, and help stop symptoms from returning.  HOME CARE INSTRUCTIONS   Put ice on the injured area.   Put ice in a plastic bag.   Place a towel between your skin and the bag.   Leave the ice on for 15-20 minutes, 3-4 times a day.   If your injury was severe, you may have been given a cervical  collar to wear. A cervical collar is a two-piece collar designed to keep your neck from moving while it heals.  Do not remove the collar unless instructed by your health care provider.  If you have long hair, keep it outside of the collar.  Ask your health care provider before making any adjustments to your collar. Minor adjustments may be required over time to improve comfort and reduce pressure on your chin or on the back of your head.  Ifyou are allowed to remove the collar for cleaning or bathing, follow your  health care provider's instructions on how to do so safely.  Keep your collar clean by wiping it with mild soap and water and drying it completely. If the collar you have been given includes removable pads, remove them every 1-2 days and hand wash them with soap and water. Allow them to air dry. They should be completely dry before you wear them in the collar.  If you are allowed to remove the collar for cleaning and bathing, wash and dry the skin of your neck. Check your skin for irritation or sores. If you see any, tell your health care provider.  Do not drive while wearing the collar.   Only take over-the-counter or prescription medicines for pain, discomfort, or fever as directed by your health care provider.   Keep all follow-up appointments as directed by your health care provider.   Keep all physical therapy appointments as directed by your health care provider.   Make any needed adjustments to your workstation to promote good posture.   Avoid positions and activities that make your symptoms worse.   Warm up and stretch before being active to help prevent problems.  SEEK MEDICAL CARE IF:   Your pain is not controlled with medicine.   You are unable to decrease your pain medicine over time as planned.   Your activity level is not improving as expected.  SEEK IMMEDIATE MEDICAL CARE IF:   You develop any bleeding.  You develop stomach upset.  You have signs of an allergic reaction to your medicine.   Your symptoms get worse.   You develop new, unexplained symptoms.   You have numbness, tingling, weakness, or paralysis in any part of your body.  MAKE SURE YOU:   Understand these instructions.  Will watch your condition.  Will get help right away if you are not doing well or get worse.   This information is not intended to replace advice given to you by your health care provider. Make sure you discuss any questions you have with your health care  provider.   Document Released: 10/15/2006 Document Revised: 12/23/2012 Document Reviewed: 06/25/2012 Elsevier Interactive Patient Education [employer]

## 2015-07-14 ENCOUNTER — Other Ambulatory Visit: Payer: Self-pay | Admitting: Family Medicine

## 2015-07-14 ENCOUNTER — Telehealth: Payer: Self-pay | Admitting: *Deleted

## 2015-07-14 MED ORDER — CYCLOBENZAPRINE HCL 10 MG PO TABS: 5.0000 mg | ORAL_TABLET | Freq: Two times a day (BID) | ORAL | Status: DC | PRN

## 2015-07-14 NOTE — Telephone Encounter (Signed)
Patient states that she would like to know if she can have flexeril instead of methocarbamal.  States that she was already using this and it's not helping her at all and she has not able to sleep.  She would like to have a couple days flexeril if possible.  Patient also mentioned that she hasn't picked up this script either.  Will forward to Md to advise. Jazmin Hartsell,CMA

## 2015-07-14 NOTE — Telephone Encounter (Signed)
Please instruct patient to DISCONTINUE robaxin.  I called pharmacy to cancel yesterday's rx.  A short supply of flexeril has been rx'd.  Please advise patient.

## 2015-07-15 NOTE — Telephone Encounter (Signed)
Pt informed. Sharon T Saunders, CMA  

## 2015-07-20 ENCOUNTER — Ambulatory Visit (INDEPENDENT_AMBULATORY_CARE_PROVIDER_SITE_OTHER): Payer: Self-pay | Admitting: Family Medicine

## 2015-07-20 ENCOUNTER — Encounter: Payer: Self-pay | Admitting: Family Medicine

## 2015-07-20 VITALS — BP 140/85 | HR 81 | Temp 98.0°F | Wt 210.0 lb

## 2015-07-20 DIAGNOSIS — M6249 Contracture of muscle, multiple sites: Secondary | ICD-10-CM

## 2015-07-20 DIAGNOSIS — M62838 Other muscle spasm: Secondary | ICD-10-CM

## 2015-07-20 MED ORDER — IBUPROFEN 800 MG PO TABS: 800.0000 mg | ORAL_TABLET | Freq: Three times a day (TID) | ORAL | Status: DC

## 2015-07-20 MED ORDER — CYCLOBENZAPRINE HCL 10 MG PO TABS
10.0000 mg | ORAL_TABLET | Freq: Three times a day (TID) | ORAL | Status: DC | PRN
Start: 2015-07-20 — End: 2016-11-14

## 2015-07-20 MED ORDER — HYDROCHLOROTHIAZIDE 25 MG PO TABS: ORAL_TABLET | ORAL | Status: DC
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
## 2015-07-20 NOTE — Patient Instructions (Signed)
Please call and make a physical therapy appt as soon as possible. Follow up in our clinic if your pain is not better in the next week.   [facility] [address] [place], [place], [place] [address] [phone] DASH Eating Plan DASH stands for "Dietary Approaches to Stop Hypertension." The DASH eating plan is a healthy eating plan that has been shown to reduce high blood pressure (hypertension). Additional health benefits may include reducing the risk of type 2 diabetes mellitus, heart disease, and stroke. The DASH eating plan may also help with weight loss. WHAT DO I NEED TO KNOW ABOUT THE DASH EATING PLAN? For the DASH eating plan, you will follow these general guidelines:  Choose foods with a percent daily value for sodium of less than 5% (as listed on the food label).  Use salt-free seasonings or herbs instead of table salt or sea salt.  Check with your health care provider or pharmacist before using salt substitutes.  Eat lower-sodium products, often labeled as "lower sodium" or "no salt added."  Eat fresh foods.  Eat more vegetables, fruits, and low-fat dairy products.  Choose whole grains. Look for the word "whole" as the first word in the ingredient list.  Choose fish and skinless chicken or [place] more often than red meat. Limit fish, poultry, and meat to 6 oz (170 g) each day.  Limit sweets, desserts, sugars, and sugary drinks.  Choose heart-healthy fats.  Limit cheese to 1 oz (28 g) per day.  Eat more home-cooked food and less restaurant, buffet, and fast food.  Limit fried foods.  Cook foods using methods other than frying.  Limit canned vegetables. If you do use them, rinse them well to decrease the sodium.  When eating at a restaurant, ask that your food be prepared with less salt, or no salt if possible. WHAT FOODS CAN I EAT? Seek help from a dietitian for individual calorie needs. Grains Whole grain or whole wheat  bread. Brown rice. Whole grain or whole wheat pasta. Quinoa, bulgur, and whole grain cereals. Low-sodium cereals. Corn or whole wheat flour tortillas. Whole grain cornbread. Whole grain crackers. Low-sodium crackers. Vegetables Fresh or frozen vegetables (raw, steamed, roasted, or grilled). Low-sodium or reduced-sodium tomato and vegetable juices. Low-sodium or reduced-sodium tomato sauce and paste. Low-sodium or reduced-sodium canned vegetables.  Fruits All fresh, canned (in natural juice), or frozen fruits. Meat and Other Protein Products Ground beef (85% or leaner), grass-fed beef, or beef trimmed of fat. Skinless chicken or [place]. Ground chicken or [place]. Pork trimmed of fat. All fish and seafood. Eggs. Dried beans, peas, or lentils. Unsalted nuts and seeds. Unsalted canned beans. Dairy Low-fat dairy products, such as skim or 1% milk, 2% or reduced-fat cheeses, low-fat ricotta or cottage cheese, or plain low-fat yogurt. Low-sodium or reduced-sodium cheeses. Fats and Oils Tub margarines without trans fats. Light or reduced-fat mayonnaise and salad dressings (reduced sodium). Avocado. Safflower, olive, or canola oils. Natural peanut or almond butter. Other Unsalted popcorn and pretzels. The items listed above may not be a complete list of recommended foods or beverages. Contact your dietitian for more options. WHAT FOODS ARE NOT RECOMMENDED? Grains White bread. White pasta. White rice. Refined cornbread. Bagels and croissants. Crackers that contain trans fat. Vegetables Creamed or fried vegetables. Vegetables in a cheese sauce. Regular canned vegetables. Regular canned tomato sauce and paste. Regular tomato and vegetable juices. Fruits Dried fruits. Canned fruit in light or heavy syrup. Fruit juice. Meat and Other  Protein Products Fatty cuts of meat. Ribs, chicken wings, bacon, sausage, bologna, salami, chitterlings, fatback, hot dogs, bratwurst, and packaged luncheon meats. Salted nuts and  seeds. Canned beans with salt. Dairy Whole or 2% milk, cream, half-and-half, and cream cheese. Whole-fat or sweetened yogurt. Full-fat cheeses or blue cheese. Nondairy creamers and whipped toppings. Processed cheese, cheese spreads, or cheese curds. Condiments Onion and garlic salt, seasoned salt, table salt, and sea salt. Canned and packaged gravies. Worcestershire sauce. Tartar sauce. Barbecue sauce. Teriyaki sauce. Soy sauce, including reduced sodium. Steak sauce. Fish sauce. Oyster sauce. Cocktail sauce. Horseradish. Ketchup and mustard. Meat flavorings and tenderizers. Bouillon cubes. Hot sauce. Tabasco sauce. Marinades. Taco seasonings. Relishes. Fats and Oils Butter, stick margarine, lard, shortening, ghee, and bacon fat. Coconut, palm kernel, or palm oils. Regular salad dressings. Other Pickles and olives. Salted popcorn and pretzels. The items listed above may not be a complete list of foods and beverages to avoid. Contact your dietitian for more information. WHERE CAN I FIND MORE INFORMATION? National Heart, Lung, and Blood Institute: CableProm[employer].itwww.nhlbi.nih.gov/health/health-topics/topics/dash/   This information is not intended to replace advice given to you by your health care provider. Make sure you discuss any questions you have with your health care provider.   Document Released: 12/07/2010 Document Revised: 01/08/2014 Document Reviewed: 10/22/2012 Elsevier Interactive Patient Education [employer] Elsevier Inc.

## 2015-07-20 NOTE — Assessment & Plan Note (Signed)
Imaging not concerning for acute findings. Tender over cervical and upper thoracic paraspinal muscles as well as trapezius on left. Will refill flexeril 10mg  Q8 x 10 days. Will reorder ibuprofen 800mg . Pt referral, counseled to use tiger balm each morning as well. Follow up if needed.

## 2015-07-25 ENCOUNTER — Ambulatory Visit: Payer: Self-pay | Attending: Family Medicine

## 2015-07-25 DIAGNOSIS — M542 Cervicalgia: Secondary | ICD-10-CM | POA: Insufficient documentation

## 2015-07-25 DIAGNOSIS — R252 Cramp and spasm: Secondary | ICD-10-CM | POA: Insufficient documentation

## 2015-07-25 DIAGNOSIS — R293 Abnormal posture: Secondary | ICD-10-CM | POA: Insufficient documentation
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
## 2015-07-25 NOTE — Patient Instructions (Signed)
Postural education with pillow support to LT arm and back.

## 2015-07-28 ENCOUNTER — Ambulatory Visit: Payer: Self-pay

## 2015-07-28 DIAGNOSIS — M542 Cervicalgia: Secondary | ICD-10-CM

## 2015-07-28 DIAGNOSIS — R293 Abnormal posture: Secondary | ICD-10-CM

## 2015-07-28 DIAGNOSIS — R252 Cramp and spasm: Secondary | ICD-10-CM

## 2015-07-28 NOTE — Therapy (Signed)
[facility] Outpatient Rehabilitation [facility] [address] [place], [place], [address] Phone: [phone]   Fax:  [phone]  Physical Therapy Treatment  Patient Details  Name: Wendy Mann MRN: [id number] Date of Birth: 07/12/1960 Referring Provider: Launa Flight, MD  Encounter Date: 07/28/2015      PT End of Session - 07/28/15 1402    Visit Number 2   Number of Visits 12   Date for PT Re-Evaluation 09/05/15   Authorization Type Self Pay   PT Start Time 0138  late   PT Stop Time 0222   PT Time Calculation (min) 44 min   Activity Tolerance Patient tolerated treatment well   Behavior During Therapy [facility] for tasks assessed/performed      Past Medical History:  Diagnosis Date  . Herpes simplex type 2 infection   . History of cocaine use 2000   Attends narcotic anonymous   . Hypertension     Past Surgical History:  Procedure Laterality Date  . NO PAST SURGERIES      There were no vitals filed for this visit.      Subjective Assessment - 07/28/15 1404    Subjective She reprots feeling some better . Felt better after heat post eval   Currently in Pain? Yes   Pain Score 6    Pain Location Neck   Pain Orientation Left   Pain Descriptors / Indicators Sharp;Nagging   Pain Type Acute pain   Pain Onset 1 to 4 weeks ago   Pain Frequency Constant   Aggravating Factors  sit/stand   Pain Relieving Factors rest   Multiple Pain Sites No                         [facility] Adult PT Treatment/Exercise - 07/28/15 0001      Modalities   Modalities Electrical Stimulation;Moist Heat;Ultrasound     Moist Heat Therapy   Number Minutes Moist Heat 18 Minutes   Moist Heat Location Shoulder;Cervical  LT     Electrical Stimulation   Electrical Stimulation Location LT neck and shoulder   Electrical Stimulation Action IFC   Electrical Stimulation Parameters L5 to tolerance   Electrical Stimulation Goals Pain     Ultrasound   Ultrasound Location LT neck and shoulder   Ultrasound Parameters 100% 1 Mhz, 1.6 Wcm2   Ultrasound Goals Pain     Manual Therapy   Manual Therapy Soft tissue mobilization   Soft tissue mobilization with rock tool to LT neck paraspnals and scapula to deltoids                  PT Short Term Goals - 07/25/15 0930      PT SHORT TERM GOAL #1   Title She will be independent with inital HEP   Time 3   Period Weeks   Status New     PT SHORT TERM GOAL #2   Title She will report pain decreased 30% or more    Time 3   Period Weeks     PT SHORT TERM GOAL #3   Title She will demo understanding of good siting posture   Time 3   Period Weeks   Status New     PT SHORT TERM GOAL #4   Title She will improve her active rotation ROM to     Time 3   Period Weeks   Status New           PT Long  Term Goals - 07/25/15 0931      PT LONG TERM GOAL #1   Title She will be independent with all HEP issued   Time 6   Period Weeks   Status New     PT LONG TERM GOAL #2   Title She will report pain decr 75% or more and be intermittant   Time 6   Period Weeks   Status New     PT LONG TERM GOAL #3   Title Her cervical ROM will be WNL   Time 6   Period Weeks   Status New     PT LONG TERM GOAL #4   Title RT shoulder ROM active equal to LT shoulder   Time 6   Period Weeks   Status New     PT LONG TERM GOAL #5   Title She will be able to lift 25-30 pounds with min to no pain. to work toward RTW full duty   Time 6   Period Weeks   Status New     Additional Long Term Goals   Additional Long Term Goals Yes     PT LONG TERM GOAL #6   Title No AM pain and sleep 75% improved   Time 12   Period Weeks   Status New               Plan - 07/28/15 1403    Clinical Impression Statement Pt late so did pain control today. She tolerated this well and reported feeling better post   PT Treatment/Interventions Electrical Stimulation;Iontophoresis 4mg /ml Dexamethasone;Moist  Heat;Traction;Ultrasound;Therapeutic exercise;Patient/family education;Manual techniques;Dry needling;Passive range of motion;Taping   PT Next Visit Plan dry needle next if appropriate, modalities, manual, stretch   Consulted and Agree with Plan of Care Patient      Patient will benefit from skilled therapeutic intervention in order to improve the following deficits and impairments:  Pain, Postural dysfunction, Decreased range of motion, Increased muscle spasms  Visit Diagnosis: Cervicalgia  Cramp and spasm  Abnormal posture     Problem List Patient Active Problem List   Diagnosis Date Noted  . Cervical paraspinal muscle spasm 07/20/2015  . Health care maintenance 10/11/2014  . Incontinence 10/11/2014  . Throat pain 08/11/2014  . Herpes simplex antibody positive 06/29/2013  . 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 Insomnia, idiopathic 04/20/2013  . Perioral dermatitis 08/15/2012  . OBESITY, UNSPECIFIED 10/06/2009  . HYPERTENSION, BENIGN SYSTEMIC 02/28/2006    Caprice Red  PT 07/28/2015, 2:12 PM  [facility] [address] [place], [place], [address] Phone: [phone]   Fax:  [phone]  Name: Wendy Mann MRN: [id number] Date of Birth: 28-Aug-1960

## 2015-08-01 ENCOUNTER — Ambulatory Visit: Payer: Self-pay | Admitting: Physical Therapy

## 2015-08-01 DIAGNOSIS — R293 Abnormal posture: Secondary | ICD-10-CM

## 2015-08-01 DIAGNOSIS — R252 Cramp and spasm: Secondary | ICD-10-CM

## 2015-08-01 DIAGNOSIS — M542 Cervicalgia: Secondary | ICD-10-CM

## 2015-08-01 NOTE — Patient Instructions (Signed)
Pt given handouts due to [employer] down, at time of her visit   Trigger Point Dry Needling  . What is Trigger Point Dry Needling (DN)? o DN is a physical therapy technique used to treat muscle pain and dysfunction. Specifically, DN helps deactivate muscle trigger points (muscle knots).  o A thin filiform needle is used to penetrate the skin and stimulate the underlying trigger point. The goal is for a local twitch response (LTR) to occur and for the trigger point to relax. No medication of any kind is injected during the procedure.   . What Does Trigger Point Dry Needling Feel Like?  o The procedure feels different for each individual patient. Some patients report that they do not actually feel the needle enter the skin and overall the process is not painful. Very mild bleeding may occur. However, many patients feel a deep cramping in the muscle in which the needle was inserted. This is the local twitch response.   Marland Kitchen How Will I feel after the treatment? o Soreness is normal, and the onset of soreness may not occur for a few hours. Typically this soreness does not last longer than two days.  o Bruising is uncommon, however; ice can be used to decrease any possible bruising.  o In rare cases feeling tired or nauseous after the treatment is normal. In addition, your symptoms may get worse before they get better, this period will typically not last longer than 24 hours.   . What Can I do After My Treatment? o Increase your hydration by drinking more water for the next 24 hours. o You may place ice or heat on the areas treated that have become sore, however, do not use heat on inflamed or bruised areas. Heat often brings more relief post needling. o You can continue your regular activities, but vigorous activity is not recommended initially after the treatment for 24 hours. o DN is best combined with other physical therapy such as strengthening, stretching, and other therapies.   Levator  Stretch   Grasp seat or sit on hand on side to be stretched. Turn head toward other side and look down. Use hand on head to gently stretch neck in that position. Hold _30___ seconds. Repeat on other side. Repeat _3___ times. Do _2-3___ sessions per day.  http://gt2.exer.us/30   Copyright  VHI. All rights reserved.  Side-Bending   One hand on opposite side of head, pull head to side as far as is comfortable. Stop if there is pain. Hold 30___ seconds. Repeat with other hand to other side. Repeat 30____ times. Do __2-3__ sessions per day.  Scalene stretch on left as shown in clinic.  30 sec x 3 with VC and TC    Copyright  VHI. All rights reserved.  Garen Lah, PT 08/01/15 9:33 AM Phone: [phone] Fax: [phone]

## 2015-08-01 NOTE — Therapy (Signed)
[facility] Out[facility] Rehabilitation [facility] [address] [place], [place], [address] Phone: [phone]   Fax:  [phone]  Physical Therapy Treatment  Patient Details  Name: Wendy Mann MRN: [id number] Date of Birth: 10/03/1960 Referring Provider: Launa Flight, MD  Encounter Date: 08/01/2015      PT End of Session - 08/01/15 0927    Visit Number 3   Number of Visits 12   Date for PT Re-Evaluation 09/05/15   Authorization Type Self Pay   PT Start Time 0806   PT Stop Time 0855   PT Time Calculation (min) 49 min   Activity Tolerance Patient tolerated treatment well   Behavior During Therapy [facility] for tasks assessed/performed      Past Medical History:  Diagnosis Date  . Herpes simplex type 2 infection   . History of cocaine use 2000   Attends narcotic anonymous   . Hypertension     Past Surgical History:  Procedure Laterality Date  . NO PAST SURGERIES      There were no vitals filed for this visit.      Subjective Assessment - 08/01/15 0810    Subjective Pt reports she went dancing this weekend in [address] [facility] dancing and I felt pain 30-45 afterwards.  I am a 5/10 today   Limitations Lifting;House hold activities   Diagnostic tests Xray, CT: negative   Patient Stated Goals To eliminate pain.       Currently in Pain? Yes   Pain Score 5    Pain Location Neck   Pain Orientation Left   Pain Descriptors / Indicators Sharp;Nagging   Pain Type Acute pain   Pain Onset 1 to 4 weeks ago   Pain Frequency Constant                         [facility] Adult PT Treatment/Exercise - 08/01/15 0941      Self-Care   Self-Care Other Self-Care Comments   Other Self-Care Comments  trigger point dry needling and explanation after care and precautians and review of posture     Moist Heat Therapy   Number Minutes Moist Heat 15 Minutes   Moist Heat Location Cervical     Manual Therapy   Manual Therapy Soft tissue  mobilization;Joint mobilization   Joint Mobilization cervical lateral UPA on left C-3 to c-6   Soft tissue mobilization left upper trap. levator and scalene     Neck Exercises: Stretches   Upper Trapezius Stretch 3 reps;30 seconds   Upper Trapezius Stretch Limitations left side to pain   Levator Stretch 3 reps;30 seconds   Levator Stretch Limitations left side to pain   Neck Stretch 3 reps;30 seconds   Neck Stretch Limitations scalene stretch with VC and TC          Trigger Point Dry Needling - 08/01/15 0942    Consent Given? Yes   Education Handout Provided Yes   Muscles Treated Upper Body Upper trapezius;Levator scapulae  left side only    Upper Trapezius Response Twitch reponse elicited;Palpable increased muscle length   Levator Scapulae Response Twitch response elicited;Palpable increased muscle length              PT Education - 08/01/15 0935    Education provided Yes   Education Details Pt educated on trigger point dry needling and aftercare and precautians, upper trap, levator and scalene stretch on the left   Person(s) Educated Patient   Methods Explanation;Demonstration;Tactile cues;Verbal  cues;Handout   Comprehension Verbalized understanding;Returned demonstration          PT Short Term Goals - 08/01/15 0981      PT SHORT TERM GOAL #1   Title She will be independent with inital HEP   Time 3   Period Weeks   Status On-going     PT SHORT TERM GOAL #2   Title She will report pain decreased 30% or more    Time 3   Period Weeks   Status On-going     PT SHORT TERM GOAL #3   Title She will demo understanding of good siting posture   Time 3   Period Weeks   Status On-going     PT SHORT TERM GOAL #4   Title She will improve her active rotation ROM to     Time 3   Period Weeks   Status On-going           PT Long Term Goals - 07/25/15 0931      PT LONG TERM GOAL #1   Title She will be independent with all HEP issued   Time 6   Period Weeks    Status New     PT LONG TERM GOAL #2   Title She will report pain decr 75% or more and be intermittant   Time 6   Period Weeks   Status New     PT LONG TERM GOAL #3   Title Her cervical ROM will be WNL   Time 6   Period Weeks   Status New     PT LONG TERM GOAL #4   Title RT shoulder ROM active equal to LT shoulder   Time 6   Period Weeks   Status New     PT LONG TERM GOAL #5   Title She will be able to lift 25-30 pounds with min to no pain. to work toward RTW full duty   Time 6   Period Weeks   Status New     Additional Long Term Goals   Additional Long Term Goals Yes     PT LONG TERM GOAL #6   Title No AM pain and sleep 75% improved   Time 12   Period Weeks   Status New               Plan - 08/01/15 0943    Clinical Impression Statement Pt returns to clinic apprehensive about trigger point dry needling.  Pt consented to treatment with TDN after explanation , precautians and aftercare and was given written materials..  Pt given HEP for neck stretching of left upper trap, levator and scalenes.  Pt was able to move neck with increased flexibility after treatment and stated she felt looser and less irritable after treatment   Rehab Potential Good   PT Frequency 2x / week   PT Duration 6 weeks   PT Treatment/Interventions Electrical Stimulation;Iontophoresis 4mg /ml Dexamethasone;Moist Heat;Traction;Ultrasound;Therapeutic exercise;Patient/family education;Manual techniques;Dry needling;Passive range of motion;Taping   PT Next Visit Plan assess DN and continue if helpful, dry needling left scalene if same trigger point pain.   PT Home Exercise Plan after care for TDN and HEP for left neck stretches for upper trap,levator and scalenes   Consulted and Agree with Plan of Care Patient      Patient will benefit from skilled therapeutic intervention in order to improve the following deficits and impairments:  Pain, Postural dysfunction, Decreased range of motion,  Increased muscle spasms  Visit  Diagnosis: Cervicalgia  Cramp and spasm  Abnormal posture     Problem List Patient Active Problem List   Diagnosis Date Noted  . Cervical paraspinal muscle spasm 07/20/2015  . Health care maintenance 10/11/2014  . Incontinence 10/11/2014  . Throat pain 08/11/2014  . Herpes simplex antibody positive 06/29/2013  . 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 Insomnia, idiopathic 04/20/2013  . Perioral dermatitis 08/15/2012  . OBESITY, UNSPECIFIED 10/06/2009  . HYPERTENSION, BENIGN SYSTEMIC 02/28/2006    Garen Lah, PT 08/01/15 9:47 AM Phone: [phone] Fax: [phone]  [facility] Outpatient Rehabilitation [facility] [address] [place], [place], [address] Phone: [phone]   Fax:  [phone]  Name: OAKLI ZECH MRN: [id number] Date of Birth: July 16, 1960

## 2015-08-02 ENCOUNTER — Ambulatory Visit: Payer: Self-pay | Attending: Family Medicine

## 2015-08-02 DIAGNOSIS — R293 Abnormal posture: Secondary | ICD-10-CM | POA: Insufficient documentation

## 2015-08-02 DIAGNOSIS — M542 Cervicalgia: Secondary | ICD-10-CM | POA: Insufficient documentation

## 2015-08-02 DIAGNOSIS — R252 Cramp and spasm: Secondary | ICD-10-CM | POA: Insufficient documentation

## 2015-08-02 NOTE — Therapy (Signed)
[facility] Outpatient Rehabilitation [facility] [address] [place], [place], [address] Phone: [phone]   Fax:  [phone]  Physical Therapy Treatment  Patient Details  Name: Wendy Mann MRN: [id number] Date of Birth: 01-02-1960 Referring Provider: Launa Flight, MD  Encounter Date: 08/02/2015      PT End of Session - 08/02/15 0849    Visit Number 4   Number of Visits 12   Date for PT Re-Evaluation 09/05/15   PT Start Time 0848   PT Stop Time 0942   PT Time Calculation (min) 54 min   Activity Tolerance Patient tolerated treatment well   Behavior During Therapy [facility] for tasks assessed/performed      Past Medical History:  Diagnosis Date  . Herpes simplex type 2 infection   . History of cocaine use 2000   Attends narcotic anonymous   . Hypertension     Past Surgical History:  Procedure Laterality Date  . NO PAST SURGERIES      There were no vitals filed for this visit.      Subjective Assessment - 08/02/15 0852    Subjective Felt like big knot LT shoulder and I did stretching and feels better today.  Only working 3 hours at a time.   Currently in Pain? Yes   Pain Score 4    Pain Location Neck   Pain Orientation Left   Pain Descriptors / Indicators Sharp;Nagging   Pain Type Acute pain   Pain Onset 1 to 4 weeks ago   Pain Frequency Constant   Aggravating Factors  dancing,   stand 60-90 min , helping husband   Pain Relieving Factors rest   Multiple Pain Sites No                         [facility] Adult PT Treatment/Exercise - 08/02/15 0001      Exercises   Exercises Neck     Neck Exercises: Seated   Other Seated Exercise Reviewed HEP and she was able to demo correctly     Moist Heat Therapy   Number Minutes Moist Heat 15 Minutes   Moist Heat Location Cervical     Electrical Stimulation   Electrical Stimulation Location LT neck and shoulder   Electrical Stimulation Action IFC   Electrical Stimulation  Parameters to tolerance L5   Electrical Stimulation Goals Pain     Ultrasound   Ultrasound Location Lt neck and traps   Ultrasound Parameters 100% !MHz , 1.6 Wcm2   Ultrasound Goals Pain     Manual Therapy   Joint Mobilization Gentle Pas to LT latera neck   Soft tissue mobilization left upper trap. levator and scalene with rock tool          Trigger Point Dry Needling - 08/01/15 0942    Consent Given? Yes   Education Handout Provided Yes   Muscles Treated Upper Body Upper trapezius;Levator scapulae  left side only    Upper Trapezius Response Twitch reponse elicited;Palpable increased muscle length   Levator Scapulae Response Twitch response elicited;Palpable increased muscle length              PT Education - 08/01/15 0935    Education provided Yes   Education Details Pt educated on trigger point dry needling and aftercare and precautians, upper trap, levator and scalene stretch on the left   Person(s) Educated Patient   Methods Explanation;Demonstration;Tactile cues;Verbal cues;Handout   Comprehension Verbalized understanding;Returned demonstration  PT Short Term Goals - 08/01/15 4540      PT SHORT TERM GOAL #1   Title She will be independent with inital HEP   Time 3   Period Weeks   Status On-going     PT SHORT TERM GOAL #2   Title She will report pain decreased 30% or more    Time 3   Period Weeks   Status On-going     PT SHORT TERM GOAL #3   Title She will demo understanding of good siting posture   Time 3   Period Weeks   Status On-going     PT SHORT TERM GOAL #4   Title She will improve her active rotation ROM to     Time 3   Period Weeks   Status On-going           PT Long Term Goals - 07/25/15 0931      PT LONG TERM GOAL #1   Title She will be independent with all HEP issued   Time 6   Period Weeks   Status New     PT LONG TERM GOAL #2   Title She will report pain decr 75% or more and be intermittant   Time 6    Period Weeks   Status New     PT LONG TERM GOAL #3   Title Her cervical ROM will be WNL   Time 6   Period Weeks   Status New     PT LONG TERM GOAL #4   Title RT shoulder ROM active equal to LT shoulder   Time 6   Period Weeks   Status New     PT LONG TERM GOAL #5   Title She will be able to lift 25-30 pounds with min to no pain. to work toward RTW full duty   Time 6   Period Weeks   Status New     Additional Long Term Goals   Additional Long Term Goals Yes     PT LONG TERM GOAL #6   Title No AM pain and sleep 75% improved   Time 12   Period Weeks   Status New               Plan - 08/02/15 0850    Clinical Impression Statement Wendy Mann is slowly improving with decr pain. She did well a day after dryneedle and will do again. May need to start some light band exercises for home or stab exer   PT Treatment/Interventions Electrical Stimulation;Iontophoresis 4mg /ml Dexamethasone;Moist Heat;Traction;Ultrasound;Therapeutic exercise;Patient/family education;Manual techniques;Dry needling;Passive range of motion;Taping   PT Next Visit Plan Continue DN, manual, modalities  initiate band exercise of stab neck   Consulted and Agree with Plan of Care Patient      Patient will benefit from skilled therapeutic intervention in order to improve the following deficits and impairments:  Pain, Postural dysfunction, Decreased range of motion, Increased muscle spasms  Visit Diagnosis: Cervicalgia  Cramp and spasm     Problem List Patient Active Problem List   Diagnosis Date Noted  . Cervical paraspinal muscle spasm 07/20/2015  . Health care maintenance 10/11/2014  . Incontinence 10/11/2014  . Throat pain 08/11/2014  . Herpes simplex antibody positive 06/29/2013  . 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 Insomnia, idiopathic 04/20/2013  . Perioral dermatitis 08/15/2012  . OBESITY, UNSPECIFIED 10/06/2009  . HYPERTENSION, BENIGN SYSTEMIC 02/28/2006    Caprice Red  PT 08/02/2015, 9:29 AM  [facility] Outpatient Rehabilitation [facility] [address] [place], [place], [address] Phone: [phone]   Fax:  [phone]  Name: Wendy Mann MRN: [id number] Date of Birth: 12/01/1960

## 2015-08-08 ENCOUNTER — Ambulatory Visit: Payer: Self-pay

## 2015-08-08 DIAGNOSIS — R293 Abnormal posture: Secondary | ICD-10-CM

## 2015-08-08 DIAGNOSIS — R252 Cramp and spasm: Secondary | ICD-10-CM

## 2015-08-08 DIAGNOSIS — M542 Cervicalgia: Secondary | ICD-10-CM

## 2015-08-08 NOTE — Therapy (Signed)
[facility] [address] [place], [place], [address] Phone: [phone]   Fax:  [phone]  Physical Therapy Treatment  Patient Details  Name: Wendy Mann MRN: [id number] Date of Birth: 03/07/1960 Referring Provider: Launa FlightKatheryn Timberlake, MD  Encounter Date: 08/08/2015      PT End of Session - 08/08/15 0941    Visit Number 5   Number of Visits 12   Date for PT Re-Evaluation 09/05/15   Authorization Type Self Pay   PT Start Time 0935   PT Stop Time 1030   PT Time Calculation (min) 55 min   Activity Tolerance Patient tolerated treatment well   Behavior During Therapy [facility] for tasks assessed/performed      Past Medical History:  Diagnosis Date  . Herpes simplex type 2 infection   . History of cocaine use 2000   Attends narcotic anonymous   . Hypertension     Past Surgical History:  Procedure Laterality Date  . NO PAST SURGERIES      There were no vitals filed for this visit.      Subjective Assessment - 08/08/15 0948    Subjective A little pain anterior along clavical  and occasionally  posterior shoulder   Currently in Pain? Yes   Pain Score --  2.5   Pain Location Neck   Pain Orientation Left   Pain Descriptors / Indicators Sharp;Nagging;Sore   Pain Type --  sub acute   Pain Onset More than a month ago   Pain Frequency Constant   Aggravating Factors  activity  sudden movements.    Multiple Pain Sites No            [facility] PT Assessment - 08/08/15 0001      AROM   Cervical Flexion 75   Cervical Extension 38   Cervical - Right Side Bend 42   Cervical - Left Side Bend 48   Cervical - Right Rotation 70   Cervical - Left Rotation 72                     [facility] Adult PT Treatment/Exercise - 08/08/15 0001      Neck Exercises: Supine   Other Supine Exercise Stab exercise with chin tuck and 15 reps of pro traction , arm circles, flex ext and hor abd/adduction.    Other Supine  Exercise HOR adduction stretch 2 x 30 sec LT      Moist Heat Therapy   Number Minutes Moist Heat 15 Minutes   Moist Heat Location Cervical  and shoulder     Manual Therapy   Soft tissue mobilization STEW and fascial release with pressure into  soft tissue superior clavical medial to sternum and with tool to posterior LT shouder and traps     UBE 5 min 90 RPM             PT Short Term Goals - 08/08/15 1111      PT SHORT TERM GOAL #1   Title She will be independent with inital HEP   Status On-going     PT SHORT TERM GOAL #2   Title She will report pain decreased 30% or more    Status Achieved     PT SHORT TERM GOAL #3   Title She will demo understanding of good siting posture   Status On-going     PT SHORT TERM GOAL #4   Title She will improve her active rotation ROM to  65 degrees  Baseline 70 degrees today   Status Achieved           PT Long Term Goals - 08/08/15 1112      PT LONG TERM GOAL #1   Title She will be independent with all HEP issued   Status On-going     PT LONG TERM GOAL #2   Title She will report pain decr 75% or more and be intermittant   Status On-going     PT LONG TERM GOAL #3   Title Her cervical ROM will be WNL   Status Achieved     PT LONG TERM GOAL #4   Title RT shoulder ROM active equal to LT shoulder   Status On-going     PT LONG TERM GOAL #5   Title She will be able to lift 25-30 pounds with min to no pain. to work toward RTW full duty   Status On-going               Plan - 08/08/15 [phone]    Clinical Impression Statement She is doing better with significant incr in active ROM and pain decrease.   She reports pain on clavicle similar to RT clavicle fracture in past.    PT Treatment/Interventions Electrical Stimulation;Iontophoresis /ml Dexamethasone;Moist Heat;Traction;Ultrasound;Therapeutic exercise;Patient/family education;Manual techniques;Dry needling;Passive range of motion;Taping   PT Next Visit Plan  Continue DN, manual, modalities  initiate band exercise or stab neck  for HEP next, trial of lifting   Consulted and Agree with Plan of Care Patient      Patient will benefit from skilled therapeutic intervention in order to improve the following deficits and impairments:  Pain, Postural dysfunction, Decreased range of motion, Increased muscle spasms  Visit Diagnosis: Cervicalgia  Cramp and spasm  Abnormal posture     Problem List Patient Active Problem List   Diagnosis Date Noted  . Cervical paraspinal muscle spasm 07/20/2015  . Health care maintenance 10/11/2014  . Incontinence 10/11/2014  . Throat pain 08/11/2014  . Herpes simplex antibody positive 06/29/2013  . 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 Insomnia, idiopathic 04/20/2013  . Perioral dermatitis 08/15/2012  . OBESITY, UNSPECIFIED 10/06/2009  . HYPERTENSION, BENIGN SYSTEMIC 02/28/2006    Caprice Red  PT 08/08/2015, 11:13 AM  [facility] [address] [place], [place], [address] Phone: [phone]   Fax:  [phone]  Name: Wendy Mann MRN: [id number] Date of Birth: 08-Nov-1960

## 2015-08-12 ENCOUNTER — Ambulatory Visit: Payer: Self-pay | Admitting: Physical Therapy

## 2015-08-12 DIAGNOSIS — R252 Cramp and spasm: Secondary | ICD-10-CM

## 2015-08-12 DIAGNOSIS — R293 Abnormal posture: Secondary | ICD-10-CM

## 2015-08-12 DIAGNOSIS — M542 Cervicalgia: Secondary | ICD-10-CM

## 2015-08-12 NOTE — Therapy (Signed)
[facility] Outpatient Rehabilitation [facility] [address] [place], [place], [address] Phone: [phone]   Fax:  [phone]  Physical Therapy Treatment  Patient Details  Name: Wendy Mann MRN: [id number] Date of Birth: Jan 27, 1960 Referring Provider: Launa Flight, MD  Encounter Date: 08/12/2015      PT End of Session - 08/12/15 0849    Visit Number 7   Number of Visits 12   Date for PT Re-Evaluation 09/05/15   PT Start Time 0806   PT Stop Time 0856   PT Time Calculation (min) 50 min   Activity Tolerance Patient tolerated treatment well   Behavior During Therapy [facility] for tasks assessed/performed      Past Medical History:  Diagnosis Date  . Herpes simplex type 2 infection   . History of cocaine use 2000   Attends narcotic anonymous   . Hypertension     Past Surgical History:  Procedure Laterality Date  . NO PAST SURGERIES      There were no vitals filed for this visit.      Subjective Assessment - 08/12/15 0807    Subjective "I am doing much better since last visit"    Currently in Pain? Yes   Pain Score 3    Pain Location Neck   Pain Orientation Lower;Mid;Left   Pain Descriptors / Indicators Aching;Dull;Sharp  sharp only with reaching with LUE   Pain Onset More than a month ago   Pain Frequency Intermittent   Aggravating Factors  reaching with LUE   Pain Relieving Factors resting, getting out of position                         [facility] Adult PT Treatment/Exercise - 08/12/15 0001      Moist Heat Therapy   Number Minutes Moist Heat 10 Minutes   Moist Heat Location Cervical     Manual Therapy   Manual Therapy Scapular mobilization   Joint Mobilization proximal/ distal clavicle posterior/ inferior mobs grade 2    Scapular Mobilization upward rotation scapular assist wiht flexion/ abduction with pt actively abduction with Painfree ROM 2 x 10, then 1 x 10 without assist to assess carry over  decreased pain  following scapular assist     Neck Exercises: Stretches   Other Neck Stretches rhomboid stretch 2 x 30 sec hold          Trigger Point Dry Needling - 08/12/15 0851    Consent Given? Yes   Education Handout Provided No  given previously   Muscles Treated Upper Body --  sub-clavius               PT Education - 08/12/15 0847    Education provided Yes   Education Details anatomy education regarding mechanics of the shoulder with upward rotation of the scapular with scapulohumeral rhythm and mechanics of the clavicle and surrounding musculature. updated HEP with proper form and rationle   Person(s) Educated Patient   Methods Explanation;Verbal cues;Handout   Comprehension Verbalized understanding;Verbal cues required          PT Short Term Goals - 08/08/15 1111      PT SHORT TERM GOAL #1   Title Wendy Mann will be independent with inital HEP   Status On-going     PT SHORT TERM GOAL #2   Title Wendy Mann will report pain decreased 30% or more    Status Achieved     PT SHORT TERM GOAL #3   Title Wendy Mann  will demo understanding of good siting posture   Status On-going     PT SHORT TERM GOAL #4   Title Wendy Mann will improve her active rotation ROM to  65 degrees   Baseline 70 degrees today   Status Achieved           PT Long Term Goals - 08/08/15 1112      PT LONG TERM GOAL #1   Title Wendy Mann will be independent with all HEP issued   Status On-going     PT LONG TERM GOAL #2   Title Wendy Mann will report pain decr 75% or more and be intermittant   Status On-going     PT LONG TERM GOAL #3   Title Her cervical ROM will be WNL   Status Achieved     PT LONG TERM GOAL #4   Title RT shoulder ROM active equal to LT shoulder   Status On-going     PT LONG TERM GOAL #5   Title Wendy Mann will be able to lift 25-30 pounds with min to no pain. to work toward RTW full duty   Status On-going               Plan - 08/12/15 0849    Clinical Impression Statement Wendy Mann reports pain is  getting better at a 3.5/10 today. Focused on manual to increase scapular mobility and clavical mobiltiy. DN was performed on sub-clavius; pt was monitored throughout treatment. Following DN clavicle and scapular mobs were performed which Wendy Mann reported pain dropped to 1/10. MHp post session for DN soreness.    PT Next Visit Plan assess response to DN, manual, modalities  initiate band exercise or stab neck  for HEP next, trial of lifting   PT Home Exercise Plan rhomboid stretch   Consulted and Agree with Plan of Care Patient      Patient will benefit from skilled therapeutic intervention in order to improve the following deficits and impairments:     Visit Diagnosis: Cervicalgia  Cramp and spasm  Abnormal posture     Problem List Patient Active Problem List   Diagnosis Date Noted  . Cervical paraspinal muscle spasm 07/20/2015  . Health care maintenance 10/11/2014  . Incontinence 10/11/2014  . Throat pain 08/11/2014  . Herpes simplex antibody positive 06/29/2013  . 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 Insomnia, idiopathic 04/20/2013  . Perioral dermatitis 08/15/2012  . OBESITY, UNSPECIFIED 10/06/2009  . HYPERTENSION, BENIGN SYSTEMIC 02/28/2006   Lulu RidingKristoffer Shawndale Kilpatrick PT, DPT, LAT, ATC  08/12/15  8:55 AM      [facility] [address] [place], [place], [address] Phone: [phone]   Fax:  [phone]  Name: Wendy Mann MRN: [id number] Date of Birth: 07/27/1960

## 2015-08-15 ENCOUNTER — Ambulatory Visit: Payer: Self-pay | Admitting: Physical Therapy

## 2015-08-15 DIAGNOSIS — R252 Cramp and spasm: Secondary | ICD-10-CM

## 2015-08-15 DIAGNOSIS — M542 Cervicalgia: Secondary | ICD-10-CM

## 2015-08-15 DIAGNOSIS — R293 Abnormal posture: Secondary | ICD-10-CM

## 2015-08-15 NOTE — Therapy (Signed)
[facility] [address] [place], [place], [address] Phone: [phone]   Fax:  [phone]  Physical Therapy Treatment  Patient Details  Name: Wendy Mann MRN: [id number] Date of Birth: 1960/02/26 Referring Provider: Launa FlightKatheryn Timberlake, MD  Encounter Date: 08/15/2015      PT End of Session - 08/15/15 0842    Visit Number 8   Number of Visits 12   Date for PT Re-Evaluation 09/05/15   PT Start Time 0802   PT Stop Time 0856   PT Time Calculation (min) 54 min   Activity Tolerance Patient tolerated treatment well   Behavior During Therapy [facility] for tasks assessed/performed      Past Medical History:  Diagnosis Date  . Herpes simplex type 2 infection   . History of cocaine use 2000   Attends narcotic anonymous   . Hypertension     Past Surgical History:  Procedure Laterality Date  . NO PAST SURGERIES      There were no vitals filed for this visit.      Subjective Assessment - 08/15/15 0806    Subjective "I am so much better today"    Currently in Pain? Yes   Pain Score 2    Pain Location Neck   Pain Orientation Left;Lower   Pain Descriptors / Indicators Sore   Pain Onset More than a month ago   Pain Frequency Intermittent                         [facility] Adult PT Treatment/Exercise - 08/15/15 0833      Neck Exercises: Machines for Strengthening   UBE (Upper Arm Bike) L 1.5 x 5 min  changing direciton at 2:30      Neck Exercises: Standing   Other Standing Exercises Rows 2 x 10   with red theraband, cues for form     Neck Exercises: Supine   Shoulder ABduction Both;10 reps  horizontal    Shoulder Abduction Limitations with red therband   Other Supine Exercise Ceiling punches 2 x10      Moist Heat Therapy   Number Minutes Moist Heat 10 Minutes   Moist Heat Location Cervical  pt in supine     Manual Therapy   Joint Mobilization proximal/ distal clavicle posterior/ inferior mobs  grade 2    Soft tissue mobilization rolling over the SCM on the L      Neck Exercises: Stretches   Upper Trapezius Stretch 2 reps;30 seconds   Upper Trapezius Stretch Limitations verbal cues for form   Levator Stretch 2 reps;30 seconds   Levator Stretch Limitations verbal cues for form   Other Neck Stretches rhomboid stretch 2 x 30 sec hold   Other Neck Stretches SCM 2 x 30 sec stretch on L only          Trigger Point Dry Needling - 08/15/15 0831    Consent Given? Yes   Education Handout Provided No  given previously   Muscles Treated Upper Body Sternocleidomastoid   Sternocleidomastoid Response Twitch response elicited;Palpable increased muscle length  x 2 with pistoning              PT Education - 08/15/15 0841    Education provided Yes   Education Details updated and reviewed HEP utilizing proper form and treatment rationale   Person(s) Educated Patient   Methods Explanation;Demonstration;Handout;Verbal cues   Comprehension Verbalized understanding;Returned demonstration;Verbal cues required  PT Short Term Goals - 08/08/15 1111      PT SHORT TERM GOAL #1   Title She will be independent with inital HEP   Status On-going     PT SHORT TERM GOAL #2   Title She will report pain decreased 30% or more    Status Achieved     PT SHORT TERM GOAL #3   Title She will demo understanding of good siting posture   Status On-going     PT SHORT TERM GOAL #4   Title She will improve her active rotation ROM to  65 degrees   Baseline 70 degrees today   Status Achieved           PT Long Term Goals - 08/08/15 1112      PT LONG TERM GOAL #1   Title She will be independent with all HEP issued   Status On-going     PT LONG TERM GOAL #2   Title She will report pain decr 75% or more and be intermittant   Status On-going     PT LONG TERM GOAL #3   Title Her cervical ROM will be WNL   Status Achieved     PT LONG TERM GOAL #4   Title RT shoulder ROM active  equal to LT shoulder   Status On-going     PT LONG TERM GOAL #5   Title She will be able to lift 25-30 pounds with min to no pain. to work toward RTW full duty   Status On-going               Plan - 08/15/15 0917    Clinical Impression Statement [name] reports decreased pain since last session. DN was performed on L SCM; pt was monitored throughout treatment. following DN and soft tissue work focused on stretching and scapular stabilizer exercises which where given as HEP. pt reported 0/10 pain post session today.    PT Next Visit Plan assess response to DN, manual, modalities  continue scap stabilizes, clavicle mobs, scapular mobs (upward rotation), MHP post session PRN FOTO    PT Home Exercise Plan stretching of upper trap/ levator scap, SCM, ceiling punches, horizontal abduction.     Consulted and Agree with Plan of Care Patient      Patient will benefit from skilled therapeutic intervention in order to improve the following deficits and impairments:  Pain, Postural dysfunction, Decreased range of motion, Increased muscle spasms  Visit Diagnosis: Cervicalgia  Cramp and spasm  Abnormal posture     Problem List Patient Active Problem List   Diagnosis Date Noted  . Cervical paraspinal muscle spasm 07/20/2015  . Health care maintenance 10/11/2014  . Incontinence 10/11/2014  . Throat pain 08/11/2014  . Herpes simplex antibody positive 06/29/2013  . 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 Insomnia, idiopathic 04/20/2013  . Perioral dermatitis 08/15/2012  . OBESITY, UNSPECIFIED 10/06/2009  . HYPERTENSION, BENIGN SYSTEMIC 02/28/2006   Lulu RidingKristoffer Leamon PT, DPT, LAT, ATC  08/15/15  9:33 AM      [facility] [address] [place], [place], [address] Phone: [phone]   Fax:  [phone]  Name: Wendy Mann MRN: [id number] Date of Birth: [birth year]

## 2015-08-18 ENCOUNTER — Ambulatory Visit: Payer: Self-pay | Admitting: Physical Therapy

## 2015-08-18 DIAGNOSIS — R293 Abnormal posture: Secondary | ICD-10-CM

## 2015-08-18 DIAGNOSIS — M542 Cervicalgia: Secondary | ICD-10-CM

## 2015-08-18 DIAGNOSIS — R252 Cramp and spasm: Secondary | ICD-10-CM

## 2015-08-18 NOTE — Therapy (Signed)
Wendy Mann, [place], [address] Phone: [phone]   Fax:  [phone]  Physical Therapy Treatment / discharge Note  Patient Details  Name: Wendy Mann MRN: [id number] Date of Birth: [birth year] Referring Provider: Elijio Miles, MD  Encounter Date: 08/18/2015      PT End of Session - 08/18/15 0928    Visit Number 9   Number of Visits 12   Date for PT Re-Evaluation 09/05/15   PT Start Time 0848   PT Stop Time 0930   PT Time Calculation (min) 42 min   Activity Tolerance Patient tolerated treatment well   Behavior During Therapy [facility] for tasks assessed/performed      Past Medical History:  Diagnosis Date  . Herpes simplex type 2 infection   . History of cocaine use 2000   Attends narcotic anonymous   . Hypertension     Past Surgical History:  Procedure Laterality Date  . NO PAST SURGERIES      There were no vitals filed for this visit.      Subjective Assessment - 08/18/15 0858    Subjective "I am doing better every day"    Currently in Pain? Yes   Pain Score --  .5   Pain Location Neck   Pain Orientation Left   Pain Descriptors / Indicators Sore   Pain Onset More than a month ago   Pain Frequency Intermittent            [facility] PT Assessment - 08/18/15 0001      Observation/Other Assessments   Focus on Therapeutic Outcomes (FOTO)  27% limited     AROM   Cervical Flexion 77   Cervical Extension 50   Cervical - Right Side Bend 55   Cervical - Left Side Bend 58   Cervical - Right Rotation 75   Cervical - Left Rotation 75                     [facility] Adult PT Treatment/Exercise - 08/18/15 0902      Neck Exercises: Machines for Strengthening   UBE (Upper Arm Bike) L1.5 x 67mn   changing direction at 3 min     Neck Exercises: Standing   Other Standing Exercises functional standing lifting/ carrying walking 4 x 15 ft, gradually increasing weight from 13-  33 #, lifting from floor <>waist, and rotation   verbal cues for proper form and avoiding hiking of shoulders     Neck Exercises: Stretches   Upper Trapezius Stretch 2 reps;30 seconds   Levator Stretch 2 reps;30 seconds   Other Neck Stretches rhomboid stretch 2 x 30 sec hold   Other Neck Stretches SCM 2 x 30 sec stretch on L only                PT Education - 08/18/15 0946    Education provided Yes   Education Details lifting/ carrying mechanics iwth proper posture avoiding hiking of the shoulders, keeping weight close to body and aoviding trunk posterior leaning   Person(s) Educated Patient   Methods Explanation;Verbal cues;Demonstration   Comprehension Verbalized understanding;Returned demonstration          PT Short Term Goals - 08/18/15 0942      PT SHORT TERM GOAL #1   Title She will be independent with inital HEP   Period Weeks   Status Achieved     PT SHORT TERM GOAL #2   Title She will report  pain decreased 30% or more    Time 3   Period Weeks   Status Achieved     PT SHORT TERM GOAL #3   Title She will demo understanding of good siting posture   Time 3   Period Weeks   Status Achieved     PT SHORT TERM GOAL #4   Title She will improve her active rotation ROM to  65 degrees   Time 3   Period Weeks   Status Achieved           PT Long Term Goals - 08/18/15 4076      PT LONG TERM GOAL #1   Title She will be independent with all HEP issued   Time 6   Period Weeks   Status Achieved     PT LONG TERM GOAL #2   Title She will report pain decr 75% or more and be intermittant   Time 6   Period Weeks   Status Achieved     PT LONG TERM GOAL #3   Title Her cervical ROM will be WNL   Time 6   Period Weeks   Status Achieved     PT LONG TERM GOAL #4   Title RT shoulder ROM active equal to LT shoulder   Time 6   Period Weeks   Status Achieved     PT LONG TERM GOAL #5   Title She will be able to lift 25-30 pounds with min to no pain. to  work toward RTW full duty   Time 6   Period Weeks   Status Achieved     PT LONG TERM GOAL #6   Title No AM pain and sleep 75% improved   Time 12   Period Weeks   Status Achieved               Plan - 08/18/15 0940    Clinical Impression Statement [name] has made great progress with physical therapy improving cervical mobility in all planes and additionally is in no pain. she met all goals today and reports she she is able to maintain and progress her current level of function independently. today worked on stretching and lifting/ carrying mechanics as it related to working conditions to prevent further issues with upper trap activation/ or reoccurence of tightness. pt will be discharged from PT today.    PT Next Visit Plan discharge from PT today.    PT Home Exercise Plan HEP review   Consulted and Agree with Plan of Care Patient      Patient will benefit from skilled therapeutic intervention in order to improve the following deficits and impairments:  Pain, Postural dysfunction, Decreased range of motion, Increased muscle spasms  Visit Diagnosis: Cervicalgia  Cramp and spasm  Abnormal posture     Problem List Patient Active Problem List   Diagnosis Date Noted  . Cervical paraspinal muscle spasm 07/20/2015  . Health care maintenance 10/11/2014  . Incontinence 10/11/2014  . Throat pain 08/11/2014  . Herpes simplex antibody positive 06/29/2013  . 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 Insomnia, idiopathic 04/20/2013  . Perioral dermatitis 08/15/2012  . OBESITY, UNSPECIFIED 10/06/2009  . HYPERTENSION, BENIGN SYSTEMIC 02/28/2006   Lulu Riding PT, DPT, LAT, ATC  08/18/15  9:54 AM       [facility] [address] [place], [place], [address] Phone: [phone]   Fax:  [phone]  Name: Wendy Mann MRN: [id number] Date of Birth:  1960/12/28    PHYSICAL THERAPY DISCHARGE  SUMMARY  Visits from Start of Care: 9  Current functional level related to goals / functional outcomes: FOTO 27% limited   Remaining deficits: Intermittent tightness in the L upper trap region that is relieved with stretching and exercise   Education / Equipment: HEP, posture with  Lifting/ carrying activities,   Plan: Patient agrees to discharge.  Patient goals were met. Patient is being discharged due to meeting the stated rehab goals.  ?????

## 2015-09-14 ENCOUNTER — Encounter: Payer: Self-pay | Admitting: Family Medicine

## 2015-09-14 ENCOUNTER — Ambulatory Visit (INDEPENDENT_AMBULATORY_CARE_PROVIDER_SITE_OTHER): Payer: Self-pay | Admitting: Family Medicine

## 2015-09-14 DIAGNOSIS — R21 Rash and other nonspecific skin eruption: Secondary | ICD-10-CM

## 2015-09-14 DIAGNOSIS — I10 Essential (primary) hypertension: Secondary | ICD-10-CM

## 2015-09-14 MED ORDER — HYDROCHLOROTHIAZIDE 25 MG PO TABS
ORAL_TABLET | ORAL | 1 refills | Status: DC
Start: 2015-09-14 — End: 2016-05-17

## 2015-09-14 MED ORDER — ACYCLOVIR 400 MG PO TABS: 400.0000 mg | ORAL_TABLET | Freq: Two times a day (BID) | ORAL | 3 refills | Status: DC

## 2015-09-14 NOTE — Assessment & Plan Note (Signed)
Pt making new changes in her diet and exercise, which is awesome. Her BP is great on her HCTZ, no changes today. Continued to encourage lifestyle changes.

## 2015-09-14 NOTE — Patient Instructions (Signed)
Keep up the great work with your diet and exercise! Please get your flu shot at the local pharmacy for around $25-30. I would like to see you back this year to do your pap smear. I look forward to meeting the twins!
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
## 2015-09-19 ENCOUNTER — Telehealth: Payer: Self-pay

## 2015-10-20 ENCOUNTER — Other Ambulatory Visit: Payer: Self-pay | Admitting: *Deleted

## 2015-10-20 DIAGNOSIS — G4700 Insomnia, unspecified: Secondary | ICD-10-CM

## 2015-10-21 NOTE — Telephone Encounter (Signed)
Patient needs an appointment if she is itching or has anxiety. This shouldn't be a long term medicine.

## 2015-10-21 NOTE — Telephone Encounter (Signed)
2nd request.  Harshaan Whang L, RN  

## 2015-11-08 ENCOUNTER — Other Ambulatory Visit: Payer: Self-pay | Admitting: *Deleted

## 2015-11-08 MED ORDER — HYDROXYZINE HCL 50 MG PO TABS: ORAL_TABLET | ORAL | 3 refills | Status: DC

## 2015-11-28 ENCOUNTER — Other Ambulatory Visit: Payer: Self-pay | Admitting: *Deleted

## 2015-11-28 DIAGNOSIS — R21 Rash and other nonspecific skin eruption: Secondary | ICD-10-CM

## 2015-11-29 NOTE — Telephone Encounter (Signed)
Contacted pt and scheduled her an appointment on 12/20/2015 with PCP. Lamonte SakaiZimmerman Rumple, April D, [place]

## 2015-11-29 NOTE — Telephone Encounter (Signed)
Please call patient and have her schedule an appt to discuss ongoing acyclovir use.

## 2015-12-20 ENCOUNTER — Encounter: Payer: Self-pay | Admitting: Family Medicine

## 2015-12-20 ENCOUNTER — Ambulatory Visit (INDEPENDENT_AMBULATORY_CARE_PROVIDER_SITE_OTHER): Payer: Self-pay | Admitting: Family Medicine

## 2015-12-20 VITALS — BP 122/80 | HR 74 | Temp 98.6°F | Ht 68.0 in | Wt 212.0 lb

## 2015-12-20 DIAGNOSIS — E119 Type 2 diabetes mellitus without complications: Secondary | ICD-10-CM

## 2015-12-20 DIAGNOSIS — R7301 Impaired fasting glucose: Secondary | ICD-10-CM | POA: Insufficient documentation

## 2015-12-20 DIAGNOSIS — G479 Sleep disorder, unspecified: Secondary | ICD-10-CM

## 2015-12-20 LAB — POCT GLYCOSYLATED HEMOGLOBIN (HGB A1C): HEMOGLOBIN A1C: 6.2

## 2015-12-20 MED ORDER — HYDROXYZINE HCL 50 MG PO TABS: 50.0000 mg | ORAL_TABLET | Freq: Every evening | ORAL | 0 refills | Status: DC | PRN

## 2015-12-20 NOTE — Progress Notes (Signed)
Pt does have insurance, she will go to Pharmacy to get the immunization for cheaper. Fleeger, Maryjo RochesterJessica Dawn, CMA

## 2015-12-20 NOTE — Patient Instructions (Addendum)
It was a pleasure to see you today! For your sleep, try melatonin over the counter. It can help you regulate your sleep cycles. Be sure to take it at the same time every night, 30 mins before you want to go to sleep. Also, try to limit screen time before going to bed.   For your blood sugars, prediabetes is 5.7-6.4 on your HgbA1c. Diabetes is 6.5, so you are getting dangerously close to diabetes. Your number was 6.2. It would be great if you can lose some weight, because any weight loss will help with your blood sugars and your blood pressure. Studies show that 10% weight loss (for you that would be 21 pounds), can prevent you from getting diabetes!

## 2015-12-20 NOTE — Assessment & Plan Note (Signed)
Patient is concerned by this, given extensive family history of diabetes. Did not know about, occasions of diabetes such as dialysis, amputation, sores, peripheral neuropathy. Reports that Wendy Mann is about to start an herbal supplement to decrease her blood sugars, I recommended that Wendy Mann try healthy eating and exercise instead. Wendy Mann is adamant that Wendy Mann will change her lifestyle and would like to recheck her hemoglobin A1c in 3 months. I counseled her that Wendy Mann is rapidly approaching the diagnosis of diabetes given increase in HgbA1c over the past year. Offered metformin, patient declined.

## 2015-12-20 NOTE — Assessment & Plan Note (Signed)
Patient reports one hour to fall asleep, waking once per night, and taking about 15 minutes to fall back asleep. Counseled her that part of the doctor-patient relationship is explaining what is normal and what is not. Suggested that this is likely normal for her age, she can continue to use her melatonin, and I will decrease her hydroxyzine to once per day prior to bed. Extensively counseled on safe sleep hygiene, including night shift feature on iPhone, as well as white noise, keeping room cool, reading physical books rather than things on devices. Patient amenable to discontinuing hydroxyzine after this prescription.

## 2015-12-20 NOTE — Progress Notes (Signed)
CC: medication refills   HPI  Saw commercials, noted that she was within the age to get hepatitis C testing. Reassured patient that this was tested last year was negative. She does endorse a history of using all sorts of drugs as well as crack cocaine. Has been abstinent from drugs for 18 years.  Dry mouth - noticed this 2-3 weeks ago. Not drinking as much water.  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 These are very bothersome to her, would not like to discontinue this medicine at this time.  Recently requested refills for Hydroxzine - taking this for sleep. Sleep issues: Both falling asleep and staying asleep per patient, she reports that her adult kids have this too. 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 Caffeine use - drinks black tea, green tea, no coffee, soda once a week. Goes to bed at 9-11pm, wakes up 2-3am, takes 15 min to fall back asleep, then wakes up at 6, falls back asleep after that. No one around to hear snoring. Never has OSA test. Atarax decreased time to sleep from 1 hour to 30min, watches TV before bed. Frequently uses her phone as well immediately prior to bed.  Family history of diabetes: Mom and sister are both deceased and both have diabetes, currently has living older sister with diabetes.. Husband, who is paralyzed after cancer she reports, takes metformin. Wants to cut back on her starches. About to start on a herbal medicine.   CC, SH/smoking status, and VS noted  Objective: BP 122/80   Pulse 74   Temp 98.6 F (37 C) (Oral)   Ht 5\' 8"  (1.727 m)   Wt 212 lb (96.2 kg)   SpO2 98%   BMI 32.23 kg/m  Gen: NAD, alert, cooperative, and pleasant overweight female.  HEENT: NCAT, EOMI, PERRL CV: RRR, no murmur Resp: CTAB, no wheezes, non-labored Abd: SNTND, BS present, no guarding or organomegaly Ext: No edema, warm Neuro: Alert and oriented, Speech clear, No gross  deficits  Assessment and plan:  Impaired fasting glucose Patient is concerned by this, given extensive family history of diabetes. Did not know about, occasions of diabetes such as dialysis, amputation, sores, peripheral neuropathy. Reports that she is about to start an herbal supplement to decrease her blood sugars, I recommended that she try healthy eating and exercise instead. She is adamant that she will change her lifestyle and would like to recheck her hemoglobin A1c in 3 months. I counseled her that she is rapidly approaching the diagnosis of diabetes given increase in HgbA1c over the past year. Offered metformin, patient declined.  Sleep disturbance Patient reports one hour to fall asleep, waking once per night, and taking about 15 minutes to fall back asleep. Counseled her that part of the doctor-patient relationship is explaining what is normal and what is not. Suggested that this is likely normal for her age, she can continue to use her melatonin, and I will decrease her hydroxyzine to once per day prior to bed. Extensively counseled on safe sleep hygiene, including night shift feature on iPhone, as well as white noise, keeping room cool, reading physical books rather than things on devices. Patient amenable to discontinuing hydroxyzine after this prescription.   Orders Placed This Encounter  Procedures  . POCT glycosylated hemoglobin (Hb A1C)    Meds ordered this encounter  Medications  . hydrOXYzine (ATARAX/VISTARIL) 50 MG tablet    Sig:  Take 1 tablet (50 mg total) by mouth at bedtime as needed.    Dispense:  30 tablet    Refill:  0     Loni MuseKate Timberlake, MD, PGY1 12/20/2015 4:59 PM

## 2016-01-22 ENCOUNTER — Other Ambulatory Visit: Payer: Self-pay | Admitting: Family Medicine

## 2016-01-22 DIAGNOSIS — R21 Rash and other nonspecific skin eruption: Secondary | ICD-10-CM

## 2016-04-23 ENCOUNTER — Encounter: Payer: Self-pay | Admitting: Family Medicine

## 2016-04-23 ENCOUNTER — Ambulatory Visit (INDEPENDENT_AMBULATORY_CARE_PROVIDER_SITE_OTHER): Payer: Self-pay | Admitting: Family Medicine

## 2016-04-23 VITALS — BP 120/75 | HR 81 | Temp 98.2°F | Ht 68.0 in | Wt 202.4 lb

## 2016-04-23 DIAGNOSIS — R631 Polydipsia: Secondary | ICD-10-CM

## 2016-04-23 LAB — POCT GLYCOSYLATED HEMOGLOBIN (HGB A1C): HEMOGLOBIN A1C: 6.2

## 2016-04-23 MED ORDER — FLUTICASONE PROPIONATE 50 MCG/ACT NA SUSP: 2.0000 | Freq: Every day | NASAL | 0 refills | Status: DC

## 2016-04-23 MED ORDER — CETIRIZINE HCL 10 MG PO TABS: 10.0000 mg | ORAL_TABLET | Freq: Every day | ORAL | 0 refills | Status: DC

## 2016-04-23 NOTE — Patient Instructions (Signed)
Start taking Zyrtec daily. Use Flonase daily as well, 1 spray in each nostril.

## 2016-04-23 NOTE — Progress Notes (Signed)
Subjective:     Patient ID: Wendy Mann, female   DOB: 02/01/1960, 56 y.o.   MRN: [id number]  HPI  Ms Seeney presented with a 6 day history of watery eyes and cough productive of green mucus that progressed to headache, chills, loss of appetite, and a change in taste/sense of smell over the last 4 days. Similar symptoms first presented in February of this year. Believes symptoms are associated with cold weather. She has only tried Careers adviser which completely relieved symptoms. No sore throat, no sneezing, and no rhinorrhea. Family hx includes 2 sisters that developed allergies after 90 and a daughter with allergies and eczema.    Review of Systems  Constitutional: Positive for appetite change, chills and fatigue.  HENT: Positive for congestion, facial swelling and sinus pressure. Negative for rhinorrhea, sneezing and sore throat.   Eyes: Positive for discharge.  Respiratory: Positive for cough. Negative for apnea, chest tightness and shortness of breath.   Cardiovascular: Negative for chest pain and palpitations.  Gastrointestinal: Negative for constipation, diarrhea and nausea.  Neurological: Positive for headaches. Negative for dizziness and light-headedness.       Objective:   Physical Exam HEENT: PERRL, No watery discharge bilaterally, Air fluid bubbles behind TM bilaterally, Boggy nasal turbinates, No pharyngeal irritation or tonsillar exudates Cardio: RRR, No m/r/g Pulmonary: Lungs clear to auscultation bilaterally, no wheezing or rales.    Assessment/Plan     See problem list

## 2016-04-24 ENCOUNTER — Telehealth: Payer: Self-pay | Admitting: Family Medicine

## 2016-04-24 NOTE — Telephone Encounter (Signed)
Saw that patient was seen yesterday for allergies. Noted that her HgbA1C is unchanged. I wanted to see her in the next month or so to discuss this and make plans for her prediabetes. Called, no answer, left generic VM. If she calls back, please schedule her with me for follow up HgbA1C.

## 2016-04-27 NOTE — Telephone Encounter (Signed)
LVM asking pt to call the office. If pt calls, please make an appt for prediabetes. See note below. Sunday Spillers, CMA

## 2016-05-08 ENCOUNTER — Other Ambulatory Visit: Payer: Self-pay | Admitting: Family Medicine

## 2016-05-15 ENCOUNTER — Other Ambulatory Visit: Payer: Self-pay | Admitting: Family Medicine

## 2016-05-17 ENCOUNTER — Other Ambulatory Visit: Payer: Self-pay | Admitting: Family Medicine

## 2016-05-17 DIAGNOSIS — R21 Rash and other nonspecific skin eruption: Secondary | ICD-10-CM

## 2016-05-17 DIAGNOSIS — I10 Essential (primary) hypertension: Secondary | ICD-10-CM

## 2016-05-17 MED ORDER — HYDROCHLOROTHIAZIDE 25 MG PO TABS: ORAL_TABLET | ORAL | 1 refills | Status: DC

## 2016-05-17 NOTE — Telephone Encounter (Signed)
Pt  calling to request refill of:  Name of Medication(s): acyclovir and hydrochlorothiazide  Last date of OV:  04/23/16 Pharmacy:  [facility]   Will route refill request to Clinic RN.  Discussed with patient policy to call pharmacy for future refills.  Also, discussed refills may take up to 48 hours to approve or deny.  Markus JarvisEmily C Pittman

## 2016-05-21 NOTE — Telephone Encounter (Signed)
Spoke to pt. Made appt for June 15 to see Dr. Nadine CountsGottschalk. PCP not available until June 19 th when pt will be out of country for 1 month. Sunday SpillersSharon T Saunders, CMA

## 2016-06-02 ENCOUNTER — Other Ambulatory Visit: Payer: Self-pay | Admitting: Family Medicine

## 2016-06-15 ENCOUNTER — Ambulatory Visit: Payer: Self-pay | Admitting: Family Medicine

## 2016-06-15 ENCOUNTER — Ambulatory Visit (INDEPENDENT_AMBULATORY_CARE_PROVIDER_SITE_OTHER): Payer: Self-pay | Admitting: Student

## 2016-06-15 ENCOUNTER — Encounter: Payer: Self-pay | Admitting: Student

## 2016-06-15 VITALS — BP 140/90 | HR 65 | Temp 97.9°F | Ht 68.0 in | Wt 202.6 lb

## 2016-06-15 DIAGNOSIS — R894 Abnormal immunological findings in specimens from other organs, systems and tissues: Secondary | ICD-10-CM

## 2016-06-15 DIAGNOSIS — Z6830 Body mass index (BMI) 30.0-30.9, adult: Secondary | ICD-10-CM

## 2016-06-15 DIAGNOSIS — J309 Allergic rhinitis, unspecified: Secondary | ICD-10-CM

## 2016-06-15 DIAGNOSIS — F5101 Primary insomnia: Secondary | ICD-10-CM

## 2016-06-15 DIAGNOSIS — R21 Rash and other nonspecific skin eruption: Secondary | ICD-10-CM

## 2016-06-15 DIAGNOSIS — Z7184 Encounter for health counseling related to travel: Secondary | ICD-10-CM

## 2016-06-15 DIAGNOSIS — E6609 Other obesity due to excess calories: Secondary | ICD-10-CM

## 2016-06-15 DIAGNOSIS — Z7189 Other specified counseling: Secondary | ICD-10-CM

## 2016-06-15 DIAGNOSIS — I10 Essential (primary) hypertension: Secondary | ICD-10-CM

## 2016-06-15 DIAGNOSIS — G4700 Insomnia, unspecified: Secondary | ICD-10-CM

## 2016-06-15 MED ORDER — FLUTICASONE PROPIONATE 50 MCG/ACT NA SUSP: 2.0000 | Freq: Every day | NASAL | 3 refills | Status: DC

## 2016-06-15 MED ORDER — CETIRIZINE HCL 10 MG PO TABS: 10.0000 mg | ORAL_TABLET | Freq: Every day | ORAL | 3 refills | Status: DC

## 2016-06-15 MED ORDER — ACYCLOVIR 400 MG PO TABS: 400.0000 mg | ORAL_TABLET | Freq: Two times a day (BID) | ORAL | 3 refills | Status: DC

## 2016-06-15 MED ORDER — HYDROCHLOROTHIAZIDE 25 MG PO TABS: ORAL_TABLET | ORAL | 1 refills | Status: DC

## 2016-06-15 MED ORDER — HYDROXYZINE HCL 25 MG PO TABS: 25.0000 mg | ORAL_TABLET | Freq: Every evening | ORAL | 3 refills | Status: DC | PRN

## 2016-06-15 NOTE — Assessment & Plan Note (Signed)
On valacyclovir 400 mg 3 times a day continuously. Discussed about the long-term effect of this on her kidney. Advised her to use when she has flare up.

## 2016-06-15 NOTE — Progress Notes (Signed)
Subjective:    Wendy Mann is a 56 y.o. old female here for medication refills  HPI Medication refills: Patient is traveling to [place]. [place] with her grandchildren who have just graduated from high school. They are taking a trip to [place] to visit their father who is in the [employer].   Hypertension: home BP in 120's/80's. She is on hydrochlorothiazide 25 mg daily. She reports good compliance with his medication. She has no cardiopulmonary or neuro symptoms today. She reports exercising at 4 days a week. She does walking and lunges for 45 minutes twice a week and dancing for 45 minutes twice a week. Usually eats at home and watches sodium intake in diet but hasn't been recently since her grandchildren's graduation.   PMH/Problem List:  has OBESITY, UNSPECIFIED; HYPERTENSION, BENIGN SYSTEMIC; Perioral dermatitis; Substance abuse in remission; Insomnia, idiopathic; Herpes simplex antibody positive; Throat pain; Encounter for counseling for travel; Incontinence; Cervical paraspinal muscle spasm; Impaired fasting glucose; Sleep disturbance; and Allergic rhinitis on her problem list.   has a past medical history of Herpes simplex type 2 infection; History of cocaine use (2000); and Hypertension.  FH:  Family History  Problem Relation Age of Onset  . Stroke Mother   . Hypertension Mother   . Stroke Sister   . Heart disease Sister   . Colon cancer Neg Hx     SH Social History  Substance Use Topics  . Smoking status: Former Games developer  . Smokeless tobacco: Never Used  . Alcohol use No    Review of Systems Review of systems negative except for pertinent positives and negatives in history of present illness above.     Objective:     Vitals:   06/15/16 0850 06/15/16 0919  BP: (!) 138/98 140/90  Pulse: 65   Temp: 97.9 F (36.6 C)   TempSrc: Oral   SpO2: 99%   Weight: 202 lb 9.6 oz (91.9 kg)   Height: 5\' 8"  (1.727 m)    Body mass index is 30.81 kg/m.  Physical Exam GEN: appears  well, no apparent distress. CVS: RRR, nl S1&S2, no murmurs, no edema,  2+ DP & PT pulses bilaterally RESP: no IWOB, good air movement bilaterally, CTAB GI: BS present & normal, soft, NTND SKIN: no apparent skin lesion ENDO: negative thyromegally NEURO: alert and oiented appropriately, no gross defecits  PSYCH: euthymic mood with congruent affect    Assessment and Plan:  HYPERTENSION, BENIGN SYSTEMIC  Initial blood pressure elevated to 138/98. Repeat blood pressure 140/90. She reports home blood pressure in 120/80's. Wonder if there is white coat component here. Will continue hydrochlorothiazide. Gave her 3 month supply with 3 refills.  Emphasized lifestyle changes including exercise and diet, and gave handout. Advised against NSAID use for pain. Patient to keep blood pressure log and bring to her next appointment. We'll obtain CMP and lipid panel today. Follow-up in 3 months or sooner if needed.   Insomnia, idiopathic Is on hydroxyzine 50 mg 3 times a day but reports taking only at bedtime. Change it to hydroxyzine 25 mg at bedtime as needed for insomnia  Herpes simplex antibody positive On valacyclovir 400 mg 3 times a day continuously. Discussed about the long-term effect of this on her kidney. Advised her to use when she has flare up.   Allergic rhinitis Refilled her Flonase today.  OBESITY, UNSPECIFIED BMI 30. Lipid panel today. CMP for baseline LFTs in case we have to initiate statins.  Encounter for counseling for travel [place] to [place] about [place]. Not planning outdoor activities. Discussed about CDC recommended vaccines and health precautions for travelers to [place]. Suggested calling [facility] as soon as possible to discuss more on this.   Orders Placed This Encounter  Procedures  . CMP14+EGFR  . Lipid Panel    Return in about 3 months (around 09/15/2016) for Hypertension.  Mercy Riding, MD 06/15/16 Pager: [phone]

## 2016-06-15 NOTE — Assessment & Plan Note (Signed)
Is on hydroxyzine 50 mg 3 times a day but reports taking only at bedtime. Change it to hydroxyzine 25 mg at bedtime as needed for insomnia

## 2016-06-15 NOTE — Patient Instructions (Signed)
It was great seeing you today! We have addressed the following issues today 1. Blood pressure: Your blood pressure is 140/90 mmHg today. Your goal blood pressure is less than 140/90 mmHg. I recommend keeping blood pressure log and bring in to your next visit. Keep up the good work with exercise. I also suggested dietary changes as below, and avoiding ibuprofen or over-the-counter pain medications except Tylenol. Follow-up in 3 months. 2.   Medication refill: I have refilled all your medications today. I also recommend taking Acyclovir only when you have flare up.   If we did any lab work today, and the results require attention, either me or my nurse will get in touch with you. If everything is normal, you will get a letter in mail and a message via . If you don't hear from us in two weeks, please give us a call. Otherwise, we look forward to seeing you again at your next visit. If you have any questions or concerns before then, please call the clinic at [phone].  Please bring all your medications to every doctors visit  Sign up for My Chart to have easy access to your labs results, and communication with your Primary care physician.    Please check-out at the front desk before leaving the clinic.    Take Care,   Dr. Alanda SlimGonfa  Portion Size    Choose healthier foods such as 100% whole grains, vegetables, fruits, beans, nut seeds, olive oil, most vegetable oils, fat-free dietary, wild game and fish.   Avoid sweet tea, other sweetened beverages, soda, fruit juice, cold cereal and milk and trans fat.   Eat at least 3 meals and 1-2 snacks per day.  Aim for no more than 5 hours between eating.  Eat breakfast within one hour of getting up.    Exercise at least 150 minutes per week, including weight resistance exercises 3 or 4 times per week.   Try to lose at least 7-10% of your current body weight.

## 2016-06-15 NOTE — Assessment & Plan Note (Signed)
Refilled her Flonase today. 

## 2016-06-15 NOTE — Assessment & Plan Note (Addendum)
BMI 30. Lipid panel today. CMP for baseline LFTs in case we have to initiate statins.

## 2016-06-15 NOTE — Assessment & Plan Note (Addendum)
Initial blood pressure elevated to 138/98. Repeat blood pressure 140/90. She reports home blood pressure in 120/80's. Wonder if there is white coat component here. Will continue hydrochlorothiazide. Gave her 3 month supply with 3 refills.  Emphasized lifestyle changes including exercise and diet, and gave handout. Advised against NSAID use for pain. Patient to keep blood pressure log and bring to her next appointment. We'll obtain CMP and lipid panel today. Follow-up in 3 months or sooner if needed.

## 2016-06-15 NOTE — Assessment & Plan Note (Addendum)
[place] in about [place]. Not planning outdoor activities. Discussed about CDC recommended vaccines and health precautions for travelers to [place]. Suggested calling [facility] as soon as possible to discuss more on this.

## 2016-06-16 ENCOUNTER — Encounter: Payer: Self-pay | Admitting: Student

## 2016-06-16 LAB — CMP14+EGFR
ALT: 14 IU/L (ref 0–32)
AST: 19 IU/L (ref 0–40)
Albumin/Globulin Ratio: 1.6 (ref 1.2–2.2)
Albumin: 4.5 g/dL (ref 3.5–5.5)
Alkaline Phosphatase: 66 IU/L (ref 39–117)
BUN/Creatinine Ratio: 12 (ref 9–23)
BUN: 11 mg/dL (ref 6–24)
Bilirubin Total: 0.5 mg/dL (ref 0.0–1.2)
CALCIUM: 10 mg/dL (ref 8.7–10.2)
CO2: 24 mmol/L (ref 20–29)
CREATININE: 0.9 mg/dL (ref 0.57–1.00)
Chloride: 102 mmol/L (ref 96–106)
GFR calc Af Amer: 84 mL/min/{1.73_m2} (ref >59)
GFR calc non Af Amer: 73 mL/min/{1.73_m2} (ref >59)
GLOBULIN, TOTAL: 2.9 g/dL (ref 1.5–4.5)
GLUCOSE: 95 mg/dL (ref 65–99)
Potassium: 3.9 mmol/L (ref 3.5–5.2)
Sodium: 143 mmol/L (ref 134–144)
Total Protein: 7.4 g/dL (ref 6.0–8.5)

## 2016-06-16 LAB — LIPID PANEL
CHOL/HDL RATIO: 2.6 ratio (ref 0.0–4.4)
Cholesterol, Total: 200 mg/dL — ABNORMAL HIGH (ref 100–199)
HDL: 78 mg/dL (ref >39)
LDL CALC: 112 mg/dL — AB (ref 0–99)
TRIGLYCERIDES: 52 mg/dL (ref 0–149)
VLDL CHOLESTEROL CAL: 10 mg/dL (ref 5–40)

## 2016-06-16 NOTE — Progress Notes (Signed)
Cholesterol with ASCVD risk of 5.5%. She is not diabetic. Recommended diet and exercise. BMP normal. Sent result letter to patient.

## 2016-10-22 ENCOUNTER — Other Ambulatory Visit: Payer: Self-pay | Admitting: Family Medicine

## 2016-10-22 DIAGNOSIS — I10 Essential (primary) hypertension: Secondary | ICD-10-CM

## 2016-10-22 MED ORDER — HYDROCHLOROTHIAZIDE 25 MG PO TABS: ORAL_TABLET | ORAL | 0 refills | Status: DC

## 2016-10-22 NOTE — Progress Notes (Signed)
Received fax requesting hydroxyzine refill. Filled this. Please call patient and have her be seen to recheck BP.

## 2016-10-23 NOTE — Progress Notes (Signed)
Contacted pt and scheduled an appointment. Zimmerman Rumple, Georges Victorio D, CMA  

## 2016-10-24 ENCOUNTER — Other Ambulatory Visit: Payer: Self-pay | Admitting: Student

## 2016-10-24 DIAGNOSIS — R21 Rash and other nonspecific skin eruption: Secondary | ICD-10-CM

## 2016-10-30 ENCOUNTER — Telehealth: Payer: Self-pay | Admitting: Family Medicine

## 2016-10-30 NOTE — Telephone Encounter (Signed)
Received 90 day request for atarax, denied this as already refilled on 10/22.

## 2016-11-14 ENCOUNTER — Other Ambulatory Visit: Payer: Self-pay

## 2016-11-14 ENCOUNTER — Encounter: Payer: Self-pay | Admitting: Family Medicine

## 2016-11-14 ENCOUNTER — Ambulatory Visit: Payer: Self-pay | Admitting: Family Medicine

## 2016-11-14 VITALS — BP 132/84 | HR 79 | Temp 98.1°F | Ht 68.0 in | Wt 210.4 lb

## 2016-11-14 DIAGNOSIS — F5101 Primary insomnia: Secondary | ICD-10-CM

## 2016-11-14 DIAGNOSIS — I10 Essential (primary) hypertension: Secondary | ICD-10-CM

## 2016-11-14 DIAGNOSIS — E119 Type 2 diabetes mellitus without complications: Secondary | ICD-10-CM

## 2016-11-14 DIAGNOSIS — Z1239 Encounter for other screening for malignant neoplasm of breast: Secondary | ICD-10-CM

## 2016-11-14 DIAGNOSIS — R7301 Impaired fasting glucose: Secondary | ICD-10-CM

## 2016-11-14 DIAGNOSIS — Z1231 Encounter for screening mammogram for malignant neoplasm of breast: Secondary | ICD-10-CM

## 2016-11-14 LAB — POCT GLYCOSYLATED HEMOGLOBIN (HGB A1C): HEMOGLOBIN A1C: 6.1

## 2016-11-14 MED ORDER — HYDROCHLOROTHIAZIDE 25 MG PO TABS: ORAL_TABLET | ORAL | 0 refills | Status: DC

## 2016-11-14 MED ORDER — MELATONIN 10 MG PO TABS: 10.0000 mg | ORAL_TABLET | Freq: Every day | ORAL | 1 refills | Status: DC

## 2016-11-14 NOTE — Patient Instructions (Addendum)
It was a pleasure to see you today! Thank you for choosing [facility] for your primary care. Jacqualine A Ciano was seen for sleep, HTN, DM.   Our plans for today were:  Keep up the good work with your blood pressure.   For your sleep, try the melatonin and white noise app.   Your blood sugar number is about the same. Keep up the good work with exercising, but try to increase the amount of vegetables and lean protein you ate, while decreasing carbs.   To keep you healthy, we need to monitor some screening tests. You are due for mammogram. Please schedule these.   You should return to our clinic to see Dr. Chanetta Marshallimberlake in 6 months for BP.   Best,  Dr. Chanetta Marshallimberlake   Diet Recommendations for Diabetes   Starchy (carb) foods: Bread, rice, pasta, potatoes, corn, cereal, grits, crackers, bagels, muffins, all baked goods.  (Fruits, milk, and yogurt also have carbohydrate, but most of these foods will not spike your blood sugar as most starchy foods will.)  A few fruits do cause high blood sugars; use small portions of bananas (limit to 1/2 at a time), grapes, watermelon, oranges, and most tropical fruits.    Protein foods: Meat, fish, poultry, eggs, dairy foods, and beans such as pinto and kidney beans (beans also provide carbohydrate).   1. Eat at least 3 meals and 1-2 snacks per day. Never go more than 4-5 hours while awake without eating. Eat breakfast within the first hour of getting up.   2. Limit starchy foods to TWO per meal and ONE per snack. ONE portion of a starchy  food is equal to the following:   - ONE slice of bread (or its equivalent, such as half of a hamburger bun).   - 1/2 cup of a "scoopable" starchy food such as potatoes or rice.   - 15 grams of Total Carbohydrate as shown on food label.  3. Include at every meal: a protein food, a carb food, and vegetables and/or fruit.   - Obtain twice the volume of veg's as protein or carbohydrate foods for both lunch and dinner.    - Fresh or frozen veg's are best.   - Keep frozen veg's on hand for a quick vegetable serving.

## 2016-11-14 NOTE — Progress Notes (Signed)
   CC: HTN   HPI Weight - drinking lots of water, walking and dancing.   htn - no home BP checks. Compliant with HCTZ. [facility]. High once about a month ago. Now good since. Got her kids off to college and feeling good about this.   DM - not checking sugars at home. Continues to use essential oil supplements and has been walking.   Sleep trouble falling asleep. Doesn't watch TV. Does get on her phone. Reads before bed. Tries to go to bed 10pm. Been taking atarax at 9pm. Drinks herbalife tea (?caffiene) last drink at lunch.   Recheck pap with possibly 5 years.   ROS: denies CP, SOB, abd pain, dysuria, changes in BMs.   CC, SH/smoking status, and VS noted  Objective: BP 132/84   Pulse 79   Temp 98.1 F (36.7 C) (Oral)   Ht 5\' 8"  (1.727 m)   Wt 210 lb 6.4 oz (95.4 kg)   SpO2 97%   BMI 31.99 kg/m  Gen: NAD, alert, cooperative, and pleasant. HEENT: NCAT, EOMI, PERRL CV: RRR, no murmur Resp: CTAB, no wheezes, non-labored Ext: No edema, warm Neuro: Alert and oriented, Speech clear, No gross deficits Diabetic Foot Check -  Appearance - no lesions, ulcers or calluses Skin - no unusual pallor or redness Monofilament testing -  Right - Great toe, medial, central, lateral ball and posterior foot intact Left - Great toe, medial, central, lateral ball and posterior foot intact  Assessment and plan:  HYPERTENSION, BENIGN SYSTEMIC Continue HCTZ. Check microalbumin today, consider ACEi instead if positive.   Impaired fasting glucose Hgba1c stable with prediabetes. Patient is going to continue to work on lifestyle changes.   Insomnia, idiopathic Patient wants to try melatonin QHS and discussed continued sleep hygiene.    Orders Placed This Encounter  Procedures  . MM Digital Screening    Standing Status:   Future    Standing Expiration Date:   01/14/2018    Order Specific Question:   Reason for Exam (SYMPTOM  OR DIAGNOSIS REQUIRED)    Answer:   screening    Order Specific Question:   Is the patient pregnant?    Answer:   No    Order Specific Question:   Preferred [facility]?    [facility]  . Microalbumin / creatinine urine ratio  . POCT HgB A1C (CPT 83036)    Meds ordered this encounter  Medications  . hydrochlorothiazide (HYDRODIURIL) 25 MG tablet    Sig: TAKE 1 TABLET (25 MG TOTAL) BY MOUTH DAILY.    Dispense:  270 tablet    Refill:  0  . Melatonin 10 MG TABS    Sig: Take 10 mg at bedtime by mouth.    Dispense:  30 tablet    Refill:  1    Health Maintenance reviewed - reviewed last PAP smear - HPV negative and satisfactory sample > can go every 5 years, referred for mammogram.  Wendy MuseKate Mckenna Boruff, MD, PGY2 11/19/2016 1:11 PM

## 2016-11-15 LAB — MICROALBUMIN / CREATININE URINE RATIO
Creatinine, Urine: 110.2 mg/dL
Microalb/Creat Ratio: 37.1 mg/g creat — ABNORMAL HIGH (ref 0.0–30.0)
Microalbumin, Urine: 40.9 ug/mL

## 2016-11-19 NOTE — Assessment & Plan Note (Signed)
Hgba1c stable with prediabetes. Patient is going to continue to work on lifestyle changes.

## 2016-11-19 NOTE — Assessment & Plan Note (Signed)
Patient wants to try melatonin QHS and discussed continued sleep hygiene.

## 2016-11-19 NOTE — Assessment & Plan Note (Signed)
Continue HCTZ. Check microalbumin today, consider ACEi instead if positive.

## 2016-12-26 ENCOUNTER — Telehealth: Payer: Self-pay | Admitting: *Deleted

## 2016-12-26 NOTE — Telephone Encounter (Signed)
Patient Wendy Mann on nurse line.  She wants to know if Dr. Chanetta Marshallimberlake thinks that a Keto Diet would be good for her . Saoirse Legere, Maryjo RochesterJessica Dawn, CMA

## 2016-12-28 ENCOUNTER — Other Ambulatory Visit: Payer: Self-pay | Admitting: Family Medicine

## 2017-01-02 NOTE — Telephone Encounter (Signed)
Patient came by office asking if anyone has reviewed this message sent on 12/26/16. Please give patient a call at [phone] or work # [phone]

## 2017-01-02 NOTE — Telephone Encounter (Signed)
Please call and let patient know that it would be appropriate for her to schedule a visit to be seen in clinic to discuss diet in person.

## 2017-01-04 NOTE — Telephone Encounter (Signed)
Contacted pt and gave her the below information and scheduled her an appointment on 01/17/17 with Dr. Chanetta Marshallimberlake to discuss this. Lamonte SakaiZimmerman Rumple, Benjamyn Hestand D, [place]

## 2017-01-17 ENCOUNTER — Encounter: Payer: Self-pay | Admitting: Family Medicine

## 2017-01-17 ENCOUNTER — Other Ambulatory Visit: Payer: Self-pay

## 2017-01-17 ENCOUNTER — Ambulatory Visit: Payer: Self-pay | Admitting: Family Medicine

## 2017-01-17 VITALS — BP 116/70 | HR 68 | Temp 98.1°F | Ht 68.0 in | Wt 210.8 lb

## 2017-01-17 DIAGNOSIS — R21 Rash and other nonspecific skin eruption: Secondary | ICD-10-CM

## 2017-01-17 DIAGNOSIS — Z1239 Encounter for other screening for malignant neoplasm of breast: Secondary | ICD-10-CM

## 2017-01-17 DIAGNOSIS — Z713 Dietary counseling and surveillance: Secondary | ICD-10-CM

## 2017-01-17 DIAGNOSIS — Z23 Encounter for immunization: Secondary | ICD-10-CM

## 2017-01-17 MED ORDER — MELATONIN 10 MG PO TABS: 10.0000 mg | ORAL_TABLET | Freq: Every day | ORAL | 1 refills | Status: DC

## 2017-01-17 MED ORDER — ACYCLOVIR 400 MG PO TABS: 400.0000 mg | ORAL_TABLET | Freq: Two times a day (BID) | ORAL | 3 refills | Status: DC

## 2017-01-17 NOTE — Patient Instructions (Signed)
It was a pleasure to see you today! Thank you for choosing [facility] for your primary care. Wendy Mann was seen for weight loss counseling.   Our plans for today were:  Try to maximize your protein and vegetable intake.   Drink as much water as you can.   Stop the supplement if you feel unwell.   You should return to our clinic to see Dr. Chanetta Marshallimberlake in 6 months for physical.   Best,  Dr. Chanetta Marshallimberlake

## 2017-01-17 NOTE — Progress Notes (Signed)
   CC: Discussed ketogenic diet   HPI patient presents after having researched the keto diet via a friend and purchased over-the-counter supplements for such.  She left a phone message and was counseled to come discuss this with me. She has tried a few over-the-counter supplements for weight loss in the past as well.  Attempting to drink more water.  She does work at [employer], which is something for her regarding eating subs with lots of carbs.  taking exercise classes 3 times per week, and She has a friend who is doing this with her and keeping her accountable.  She would like to try the ketogenic diet, because 1 of her sponsees in AA  recently had great success with weight loss with this diet.  ROS: Denies chest pain, shortness of breath, abdominal pain, dysuria, changes in bowel movement.  CC, SH/smoking status, and VS noted  Objective: BP 116/70   Pulse 68   Temp 98.1 F (36.7 C) (Oral)   Ht 5\' 8"  (1.727 m)   Wt 210 lb 12.8 oz (95.6 kg)   SpO2 99%   BMI 32.05 kg/m  Gen: NAD, alert, cooperative, and pleasant. HEENT: NCAT, EOMI, PERRL CV: RRR, no murmur Resp: CTAB, no wheezes, non-labored Ext: No edema, warm Neuro: Alert and oriented, Speech clear, No gross deficits  Assessment and plan:  Weight loss counseling: Discussed with patient that there are risks of over-the-counter diet supplements, according to recent CBC release that these may be contaminated with prescription ingredients which were taken off the market.  Patient understands the risk and will stop this over-the-counter supplement should she experience any side effects.  Discussed with her that the most sustainable way to lose weight is to make small lifestyle changes such as increased exercise and increasing protein and vegetables in her diet.  She would like to do this in addition to her weight loss supplement.  Her goal is to lose 30 pounds before I see her in 6 months.  Encouraged her on this journey follow-up 6  months.  Orders Placed This Encounter  Procedures  . MM Digital Screening    Standing Status:   Future    Standing Expiration Date:   03/18/2018    Order Specific Question:   Reason for Exam (SYMPTOM  OR DIAGNOSIS REQUIRED)    Answer:   screening for breast cancer    Order Specific Question:   Is the patient pregnant?    Answer:   No    Order Specific Question:   Preferred imaging location?    [facility]  . Flu Vaccine QUAD 36+ mos IM    Meds ordered this encounter  Medications  . acyclovir (ZOVIRAX) 400 MG tablet    Sig: Take 1 tablet (400 mg total) by mouth 2 (two) times daily.    Dispense:  60 tablet    Refill:  3  . Melatonin 10 MG TABS    Sig: Take 10 mg by mouth at bedtime.    Dispense:  30 tablet    Refill:  1     Loni MuseKate Linzie Criss, MD, PGY2 01/18/2017 6:54 AM

## 2017-01-21 ENCOUNTER — Other Ambulatory Visit: Payer: Self-pay | Admitting: Family Medicine

## 2017-02-19 ENCOUNTER — Other Ambulatory Visit: Payer: Self-pay | Admitting: Family Medicine

## 2017-02-19 ENCOUNTER — Ambulatory Visit
Admission: RE | Admit: 2017-02-19 | Discharge: 2017-02-19 | Disposition: A | Discharge status: Home / Self Care | Payer: BLUE CROSS/BLUE SHIELD | Source: Ambulatory Visit | Attending: Family Medicine | Admitting: Family Medicine

## 2017-02-19 DIAGNOSIS — Z1239 Encounter for other screening for malignant neoplasm of breast: Secondary | ICD-10-CM

## 2017-04-19 ENCOUNTER — Other Ambulatory Visit: Payer: Self-pay | Admitting: Family Medicine

## 2017-05-08 ENCOUNTER — Telehealth: Payer: Self-pay

## 2017-05-08 NOTE — Telephone Encounter (Signed)
Pt called nurse line wanting to be referred to a hand specialist, has not been seen her for any hand issues. Scheduled her appt with Dr. Chanetta Marshall on 5.20.19 to discuss. Shawna Orleans, RN

## 2017-05-18 ENCOUNTER — Other Ambulatory Visit: Payer: Self-pay | Admitting: Family Medicine

## 2017-05-20 ENCOUNTER — Ambulatory Visit: Payer: BLUE CROSS/BLUE SHIELD | Admitting: Family Medicine

## 2017-05-20 ENCOUNTER — Encounter: Payer: Self-pay | Admitting: Family Medicine

## 2017-05-20 ENCOUNTER — Other Ambulatory Visit: Payer: Self-pay

## 2017-05-20 DIAGNOSIS — G479 Sleep disorder, unspecified: Secondary | ICD-10-CM

## 2017-05-20 DIAGNOSIS — R894 Abnormal immunological findings in specimens from other organs, systems and tissues: Secondary | ICD-10-CM

## 2017-05-20 DIAGNOSIS — Z6830 Body mass index (BMI) 30.0-30.9, adult: Secondary | ICD-10-CM

## 2017-05-20 DIAGNOSIS — E6609 Other obesity due to excess calories: Secondary | ICD-10-CM

## 2017-05-20 DIAGNOSIS — R21 Rash and other nonspecific skin eruption: Secondary | ICD-10-CM

## 2017-05-20 DIAGNOSIS — G5603 Carpal tunnel syndrome, bilateral upper limbs: Secondary | ICD-10-CM | POA: Diagnosis not present

## 2017-05-20 DIAGNOSIS — I10 Essential (primary) hypertension: Secondary | ICD-10-CM | POA: Diagnosis not present

## 2017-05-20 DIAGNOSIS — Z23 Encounter for immunization: Secondary | ICD-10-CM

## 2017-05-20 DIAGNOSIS — G5600 Carpal tunnel syndrome, unspecified upper limb: Secondary | ICD-10-CM | POA: Insufficient documentation

## 2017-05-20 MED ORDER — WRIST SPLINT LEFT/RIGHT MISC: 2.0000 [IU] | Freq: Every day | 0 refills | Status: AC

## 2017-05-20 MED ORDER — HYDROCORTISONE-ACETIC ACID 1-2 % OT SOLN: 3.0000 [drp] | Freq: Three times a day (TID) | OTIC | 0 refills | Status: DC

## 2017-05-20 MED ORDER — ACYCLOVIR 400 MG PO TABS: 400.0000 mg | ORAL_TABLET | Freq: Two times a day (BID) | ORAL | 3 refills | Status: DC

## 2017-05-20 NOTE — Assessment & Plan Note (Signed)
Melatonin working well, continue this and refilled.

## 2017-05-20 NOTE — Assessment & Plan Note (Addendum)
Recheck K today on HCTZ. Well controlled. Patient taking HCTZ.

## 2017-05-20 NOTE — Assessment & Plan Note (Signed)
Congratulated patient on 15 lb weight loss. Continue lifestyle modifications.

## 2017-05-20 NOTE — Assessment & Plan Note (Signed)
Current cold sore outbreak on upper lip. Increase acyclovir to TID x 5 days or up to 10 days until lesion resolves, then return to daily.

## 2017-05-20 NOTE — Progress Notes (Signed)
CC: hand, ear, L foot pain, mouth sore  HPI   Hand pain - on the base both thumbs, dull pain. Here and there when she goes to pick up something she gets sharp pains and she will drop something. Pharmacy put her on the easy open bottles. Has been going on 6-7 months. Had surgery on her left lateral 5th digit 2/2 lac 2 years ago. No hx of carpal tunnel. Has tried topical muscle cream, this helped.   L foot - was dancing at a wedding in heels x 8 hours 2 days ago. Didn't roll this ankle.   Mouth sores - 10 days ago it started. Put abreva on it, it got better 4 days ago and then it seemed to come back 2 days ago. Has been taking acyclovir QD, also takes lysine. Has also been stressed. This is a typical flare for her.   Ear sore - started since Thursday. Put qtip in, hurting more on the outside. No drainage. No fevers.   Wt Readings from Last 3 Encounters:  05/20/17 195 lb 12.8 oz (88.8 kg)  01/17/17 210 lb 12.8 oz (95.6 kg)  11/14/16 210 lb 6.4 oz (95.4 kg)   Has been doing keto supplements.   ROS: Denies CP, SOB, abdominal pain, dysuria, changes in BMs.   CC, SH/smoking status, and VS noted  Objective: BP 126/72   Pulse 64   Temp 97.9 F (36.6 C) (Oral)   Ht  (1.727 m)   Wt 195 lb 12.8 oz (88.8 kg)   SpO2 99%   BMI 29.77 kg/m  Gen: NAD, alert, cooperative, and pleasant. HEENT: NCAT, EOMI, PERRL. L external otic canal erythematous, pinna nontender, TM clear. Crusted erythematous lesion over upper lip, no bleeding or drainage.  CV: RRR, no murmur Resp: CTAB, no wheezes, non-labored Abd: SNTND, BS present, no guarding or organomegaly Ext: No edema, warm. +Phalens test bilaterally, L > R, normal sensation in all digits. Normal grip strength bilaterally. Mild TTP over L lateral midfoot, normal gait without pain. No swelling.  Neuro: Alert and oriented, Speech clear, No gross deficits  Assessment and plan:  HYPERTENSION, BENIGN SYSTEMIC Recheck K today on HCTZ. Well  controlled. Patient taking HCTZ.   Herpes simplex antibody positive Current cold sore outbreak on upper lip. Increase acyclovir to TID x 5 days or up to 10 days until lesion resolves, then return to daily.   Sleep disturbance Melatonin working well, continue this and refilled.   OBESITY, UNSPECIFIED Congratulated patient on 15 lb weight loss. Continue lifestyle modifications.   Carpal tunnel syndrome Bilateral, L more bothersome than right, start with wrist splints. Likely due to work at [employer] and repetitive movements.   L otitis externa - acetasol TID x 7 days. Return if not improving.   L ankle sprain - mild, has brace at home, will try brace.   Orders Placed This Encounter  Procedures  . Pneumococcal polysaccharide vaccine 23-valent greater than or equal to 2yo subcutaneous/IM  . CBC  . Basic metabolic panel  . Lipid panel    Meds ordered this encounter  Medications  . acetic acid-hydrocortisone (ACETASOL HC) OTIC solution    Sig: Place 3 drops into the left ear 3 (three) times daily.    Dispense:  10 mL    Refill:  0  . Elastic Bandages & Supports (WRIST SPLINT LEFT/RIGHT) MISC    Sig: 2 Units by Does not apply route at bedtime.    Dispense:  2 each  Refill:  0  . acyclovir (ZOVIRAX) 400 MG tablet    Sig: Take 1 tablet (400 mg total) by mouth 2 (two) times daily.    Dispense:  60 tablet    Refill:  3    Health Maintenance reviewed - pneumonia vax today.  Wendy Muse, MD, PGY2 05/20/2017 11:56 AM

## 2017-05-20 NOTE — Patient Instructions (Signed)
It was a pleasure to see you today! Thank you for choosing [facility] for your primary care. Wendy Mann was seen for hand pain, ear pain, ankle pain, lip sore.   Our plans for today were:  For your ear, use the drops three times per day until it feels better or for 1 week.   For your hand pain, use wrist splints at night.   Keep taking your blood pressure medicine.  For your sore, take the acyclovir three times per day for 5 days or up to 10 days until the lesion resolves. Then go back to 1 time per day.   Try the ankle brace for your foot.   Best,  Dr. Chanetta Marshall

## 2017-05-20 NOTE — Assessment & Plan Note (Signed)
Bilateral, L more bothersome than right, start with wrist splints. Likely due to work at [employer] and repetitive movements.

## 2017-05-21 ENCOUNTER — Telehealth: Payer: Self-pay

## 2017-05-21 LAB — LIPID PANEL
CHOL/HDL RATIO: 2.6 ratio (ref 0.0–4.4)
Cholesterol, Total: 216 mg/dL — ABNORMAL HIGH (ref 100–199)
HDL: 82 mg/dL (ref >39)
LDL CALC: 122 mg/dL — AB (ref 0–99)
Triglycerides: 59 mg/dL (ref 0–149)
VLDL Cholesterol Cal: 12 mg/dL (ref 5–40)

## 2017-05-21 LAB — CBC
HEMATOCRIT: 38.1 % (ref 34.0–46.6)
HEMOGLOBIN: 12.4 g/dL (ref 11.1–15.9)
MCH: 28.9 pg (ref 26.6–33.0)
MCHC: 32.5 g/dL (ref 31.5–35.7)
MCV: 89 fL (ref 79–97)
Platelets: 335 10*3/uL (ref 150–450)
RBC: 4.29 x10E6/uL (ref 3.77–5.28)
RDW: 14.7 % (ref 12.3–15.4)
WBC: 4.8 10*3/uL (ref 3.4–10.8)

## 2017-05-21 LAB — BASIC METABOLIC PANEL
BUN / CREAT RATIO: 14 (ref 9–23)
BUN: 12 mg/dL (ref 6–24)
CO2: 26 mmol/L (ref 20–29)
CREATININE: 0.83 mg/dL (ref 0.57–1.00)
Calcium: 10.5 mg/dL — ABNORMAL HIGH (ref 8.7–10.2)
Chloride: 100 mmol/L (ref 96–106)
GFR calc Af Amer: 92 mL/min/{1.73_m2} (ref >59)
GFR, EST NON AFRICAN AMERICAN: 80 mL/min/{1.73_m2} (ref >59)
GLUCOSE: 103 mg/dL — AB (ref 65–99)
Potassium: 3.7 mmol/L (ref 3.5–5.2)
SODIUM: 144 mmol/L (ref 134–144)

## 2017-05-21 MED ORDER — NEOMYCIN-POLYMYXIN-HC 3.5-10000-1 OT SUSP: 3.0000 [drp] | Freq: Three times a day (TID) | OTIC | 0 refills | Status: DC

## 2017-05-21 NOTE — Telephone Encounter (Signed)
[facility] and all other options are either very expensive or back ordered. I will send another option to [facility] in case they have the correct option in stock. Please call her and let her know and if that doesn't work I can try a third option (polytrim).

## 2017-05-21 NOTE — Telephone Encounter (Signed)
Patient left message that ear medication too expensive. Is there an alternative?  Call back is [phone] or [phone]  Ples Specter, RN [facility] RN)

## 2017-05-21 NOTE — Telephone Encounter (Signed)
Pt informed. Tayley Mudrick Dawn, CMA  

## 2017-05-23 ENCOUNTER — Telehealth: Payer: Self-pay | Admitting: Family Medicine

## 2017-05-23 NOTE — Telephone Encounter (Signed)
Left message to call back regarding lab work.

## 2017-07-12 ENCOUNTER — Other Ambulatory Visit: Payer: Self-pay | Admitting: Family Medicine

## 2017-08-26 ENCOUNTER — Other Ambulatory Visit (HOSPITAL_COMMUNITY)
Admission: RE | Admit: 2017-08-26 | Discharge: 2017-08-26 | Disposition: A | Discharge status: Home / Self Care | Payer: BLUE CROSS/BLUE SHIELD | Source: Ambulatory Visit | Attending: Family Medicine | Admitting: Family Medicine

## 2017-08-26 ENCOUNTER — Ambulatory Visit: Payer: BLUE CROSS/BLUE SHIELD | Admitting: Family Medicine

## 2017-08-26 ENCOUNTER — Other Ambulatory Visit: Payer: Self-pay

## 2017-08-26 ENCOUNTER — Encounter: Payer: Self-pay | Admitting: Family Medicine

## 2017-08-26 VITALS — BP 116/88 | HR 64 | Temp 98.4°F | Ht 68.0 in | Wt 199.4 lb

## 2017-08-26 DIAGNOSIS — I10 Essential (primary) hypertension: Secondary | ICD-10-CM | POA: Diagnosis not present

## 2017-08-26 DIAGNOSIS — R7309 Other abnormal glucose: Secondary | ICD-10-CM

## 2017-08-26 DIAGNOSIS — Z124 Encounter for screening for malignant neoplasm of cervix: Secondary | ICD-10-CM | POA: Diagnosis not present

## 2017-08-26 DIAGNOSIS — R7301 Impaired fasting glucose: Secondary | ICD-10-CM

## 2017-08-26 DIAGNOSIS — G5603 Carpal tunnel syndrome, bilateral upper limbs: Secondary | ICD-10-CM | POA: Diagnosis not present

## 2017-08-26 LAB — POCT GLYCOSYLATED HEMOGLOBIN (HGB A1C): HBA1C, POC (CONTROLLED DIABETIC RANGE): 6.2 % (ref 0.0–7.0)

## 2017-08-26 NOTE — Progress Notes (Signed)
   CC: hand pain, needs PAP  HPI  PAP - no hx of abnormal   Hand pain - wants referral to hand doctor. Dr. Mina Marbleweingold has seen her at work at [employer] and said he would be happy to see her. [facility] [place]. She would like referral. Both tingling sensation and some weakness. More in the R. Has tried braces and APAP PRN.   Diet controlled DM - will recheck a1c today.  Wt Readings from Last 3 Encounters:  08/26/17 199 lb 6.4 oz (90.4 kg)  05/20/17 195 lb 12.8 oz (88.8 kg)  01/17/17 210 lb 12.8 oz (95.6 kg)    ROS: Denies CP, SOB, abdominal pain, dysuria, changes in BMs.   CC, SH/smoking status, and VS noted  Objective: BP 116/88   Pulse 64   Temp 98.4 F (36.9 C) (Oral)   Ht 5\' 8"  (1.727 m)   Wt 199 lb 6.4 oz (90.4 kg)   SpO2 99%   BMI 30.32 kg/m  Gen: NAD, alert, cooperative, and pleasant. HEENT: NCAT, EOMI, PERRL CV: RRR, no murmur Resp: CTAB, no wheezes, non-labored Abd: SNTND, BS present, no guarding or organomegaly Ext: No edema, warm GU: normal external female genitalia, slightly hypopigmented vaginal mucosa. Normal appearing cervix.  Neuro: Alert and oriented, Speech clear, No gross deficits  Assessment and plan:  Carpal tunnel syndrome Patient requests hand center referral, happy to place this. Continue bracing.   HYPERTENSION, BENIGN SYSTEMIC Continue HCTZ.   Impaired fasting glucose Repeat a1c well controlled.    Orders Placed This Encounter  Procedures  . Ambulatory referral to Hand Surgery    Referral Priority:   Routine    Referral Type:   Surgical    Referral Reason:   Specialty Services Required    Requested Specialty:   Hand Surgery    Number of Visits Requested:   1  . HgB A1c    No orders of the defined types were placed in this encounter.  Loni MuseKate Timberlake, MD, PGY3 08/27/2017 4:34 PM

## 2017-08-26 NOTE — Patient Instructions (Signed)
It was a pleasure to see you today! Thank you for choosing [facility] for your primary care. Wendy Mann was seen for pap, hand referral .   Our plans for today were:  I will call you if the PAP smear is abnormal.   Your a1c was great, keep up the good work!   I placed the referral to Dr. Mina MarbleWeingold, call them next week if you don't hear anything.    Best,  Dr. Chanetta Marshallimberlake

## 2017-08-27 LAB — CYTOLOGY - PAP: Diagnosis: NEGATIVE

## 2017-08-27 NOTE — Assessment & Plan Note (Signed)
Continue HCTZ

## 2017-08-27 NOTE — Assessment & Plan Note (Signed)
Repeat a1c well controlled.

## 2017-08-27 NOTE — Assessment & Plan Note (Signed)
Patient requests hand center referral, happy to place this. Continue bracing.

## 2017-08-28 ENCOUNTER — Telehealth: Payer: Self-pay | Admitting: *Deleted

## 2017-08-28 NOTE — Telephone Encounter (Signed)
-----   Message from Garth BignessKathryn Timberlake, MD sent at 08/27/2017  3:41 PM EDT ----- [facility] team, please let patient know her pap looks normal and she can go another 5 years.

## 2017-08-28 NOTE — Telephone Encounter (Signed)
Pt informed of below. Zimmerman Rumple, April D, CMA  

## 2017-09-23 ENCOUNTER — Other Ambulatory Visit: Payer: Self-pay | Admitting: Family Medicine

## 2017-10-03 ENCOUNTER — Other Ambulatory Visit: Payer: Self-pay | Admitting: Family Medicine

## 2017-10-03 DIAGNOSIS — R21 Rash and other nonspecific skin eruption: Secondary | ICD-10-CM

## 2017-12-02 ENCOUNTER — Other Ambulatory Visit: Payer: Self-pay | Admitting: Family Medicine

## 2017-12-13 ENCOUNTER — Other Ambulatory Visit: Payer: Self-pay | Admitting: Family Medicine

## 2018-01-30 ENCOUNTER — Other Ambulatory Visit: Payer: Self-pay

## 2018-01-30 ENCOUNTER — Other Ambulatory Visit: Payer: Self-pay | Admitting: Family Medicine

## 2018-01-30 ENCOUNTER — Ambulatory Visit (INDEPENDENT_AMBULATORY_CARE_PROVIDER_SITE_OTHER): Payer: BLUE CROSS/BLUE SHIELD | Admitting: Student in an Organized Health Care Education/Training Program

## 2018-01-30 VITALS — BP 125/82 | HR 68 | Temp 97.8°F | Wt 197.0 lb

## 2018-01-30 DIAGNOSIS — R894 Abnormal immunological findings in specimens from other organs, systems and tissues: Secondary | ICD-10-CM | POA: Diagnosis not present

## 2018-01-30 DIAGNOSIS — B001 Herpesviral vesicular dermatitis: Secondary | ICD-10-CM | POA: Diagnosis not present

## 2018-01-30 DIAGNOSIS — Z1231 Encounter for screening mammogram for malignant neoplasm of breast: Secondary | ICD-10-CM

## 2018-01-30 MED ORDER — ACYCLOVIR 5 % EX OINT: 1.0000 "application " | TOPICAL_OINTMENT | Freq: Two times a day (BID) | CUTANEOUS | 1 refills | Status: AC | PRN

## 2018-01-30 NOTE — Progress Notes (Signed)
   CC: rash of upper lip  HPI: Wendy Mann is a 58 y.o. female with PMH:  Past Medical History:  Diagnosis Date  . Herpes simplex type 2 infection   . History of cocaine use 2000   Attends [employer]   . Hypertension     Patient presents with irritation and rash of upper buccal region which is been waxing and waning since December.  She reports that the rash improved and then came back.  She reports that she has been somewhat stressed out recently.  She reports that she has a history of cold sores in this area.  She reports some pain, some tingling in the area.  She has been taking acyclovir 400 mg twice daily for history of cold sores in this region.  She denies using new soaps, detergents, or lotions.  She reports that she disposed of all of her lipsticks and bought new ones as advised by her physician.  She reports that she continues to have this irritation by these interventions.  No fevers or recent illness.  No sores inside her mouth.  Recent medications.  Review of Symptoms:  See HPI for ROS.   CC, SH/smoking status, and VS noted.  Objective: BP 125/82   Pulse 68   Temp 97.8 F (36.6 C) (Oral)   Wt 197 lb (89.4 kg)   SpO2 99%   BMI 29.95 kg/m  GEN: NAD, alert, cooperative, and pleasant. RESPIRATORY: Comfortable work of breathing, speaks in full sentences CV: Regular rate noted, distal extremities well perfused and warm without edema GI: Soft, nondistended SKIN: warm and dry, no rashes or lesions NEURO: II-XII grossly intact MSK: Moves 4 extremities equally PSYCH: AAOx3, appropriate affect   Assessment and plan:  Herpes simplex antibody positive Outbreak of the upper lip. Ongoing since December. Is currently taking acyclovir PO. - acyclovir ointment (ZOVIRAX) 5 %; Apply 1 application topically 2 (two) times daily as needed.  Dispense: 5 g; Refill: 1 - continue PO acyclovir - follow up in Dermatology clinic in our office if symptoms persist or fail  to improve    Meds ordered this encounter  Medications  . acyclovir ointment (ZOVIRAX) 5 %    Sig: Apply 1 application topically 2 (two) times daily as needed.    Dispense:  5 g    Refill:  1    Howard PouchLauren Angelyse Heslin, MD,MS,  PGY3 01/30/2018 9:31 AM

## 2018-01-30 NOTE — Assessment & Plan Note (Signed)
Outbreak of the upper lip. Ongoing since December. Is currently taking acyclovir PO. - acyclovir ointment (ZOVIRAX) 5 %; Apply 1 application topically 2 (two) times daily as needed.  Dispense: 5 g; Refill: 1 - continue PO acyclovir - follow up in Dermatology clinic in our office if symptoms persist or fail to improve

## 2018-01-30 NOTE — Patient Instructions (Signed)
It was a pleasure seeing you today in our clinic. Here is the treatment plan we have discussed and agreed upon together: We can try the topical antiviral medication, which I sent to your pharmacy.  Please schedule a visit in the dermatology clinic in our office for about a month from now. If your symptoms are resolved, please cancel this visit more than 24 hours before the visit.  Our clinic's number is [phone]. Please call with questions or concerns about what we discussed today.  Be well, Dr. Mosetta Putt

## 2018-02-06 ENCOUNTER — Other Ambulatory Visit: Payer: Self-pay | Admitting: Family Medicine

## 2018-02-06 DIAGNOSIS — I10 Essential (primary) hypertension: Secondary | ICD-10-CM

## 2018-02-10 ENCOUNTER — Other Ambulatory Visit: Payer: Self-pay | Admitting: Family Medicine

## 2018-03-06 ENCOUNTER — Ambulatory Visit: Payer: Self-pay

## 2018-03-07 ENCOUNTER — Ambulatory Visit: Payer: Self-pay

## 2018-06-10 ENCOUNTER — Other Ambulatory Visit: Payer: Self-pay | Admitting: *Deleted

## 2018-06-10 DIAGNOSIS — Z20822 Contact with and (suspected) exposure to covid-19: Secondary | ICD-10-CM

## 2018-06-12 ENCOUNTER — Other Ambulatory Visit: Payer: Self-pay

## 2018-06-12 DIAGNOSIS — R21 Rash and other nonspecific skin eruption: Secondary | ICD-10-CM

## 2018-06-13 ENCOUNTER — Other Ambulatory Visit: Payer: Self-pay | Admitting: Family Medicine

## 2018-06-13 MED ORDER — ACYCLOVIR 400 MG PO TABS: 400.0000 mg | ORAL_TABLET | Freq: Two times a day (BID) | ORAL | 1 refills | Status: AC

## 2018-06-19 NOTE — Addendum Note (Signed)
Addended by: Mystique Bjelland M on: 06/19/2018 11:54 AM   Modules accepted: Orders  

## 2018-06-23 ENCOUNTER — Other Ambulatory Visit: Payer: Self-pay | Admitting: *Deleted

## 2018-06-23 DIAGNOSIS — Z20822 Contact with and (suspected) exposure to covid-19: Secondary | ICD-10-CM

## 2018-06-26 NOTE — Addendum Note (Signed)
Addended by: Brigitte Pulse on: 06/26/2018 04:03 PM   Modules accepted: Orders

## 2018-09-24 ENCOUNTER — Other Ambulatory Visit: Payer: Self-pay

## 2018-09-24 DIAGNOSIS — I10 Essential (primary) hypertension: Secondary | ICD-10-CM

## 2018-09-25 MED ORDER — HYDROCHLOROTHIAZIDE 25 MG PO TABS: ORAL_TABLET | ORAL | 2 refills | Status: AC

## 2018-09-25 NOTE — Telephone Encounter (Signed)
Please call patient and schedule follow up appointment for hypertension. They have requested refill but have not been seen recently for chronic problem follow up.   Wilber Oliphant, M.D.  PGY-2  Family Medicine  [phone] 09/25/2018 1:37 PM

## 2019-01-04 IMAGING — MG DIGITAL SCREENING BILATERAL MAMMOGRAM WITH TOMO AND CAD
8 series · 8 of 24 positions shown · non-contrast
Comparison: Previous exam(s).

CLINICAL DATA: Screening.

EXAM:
DIGITAL SCREENING BILATERAL MAMMOGRAM WITH TOMO AND CAD

[L CC synth-2D]
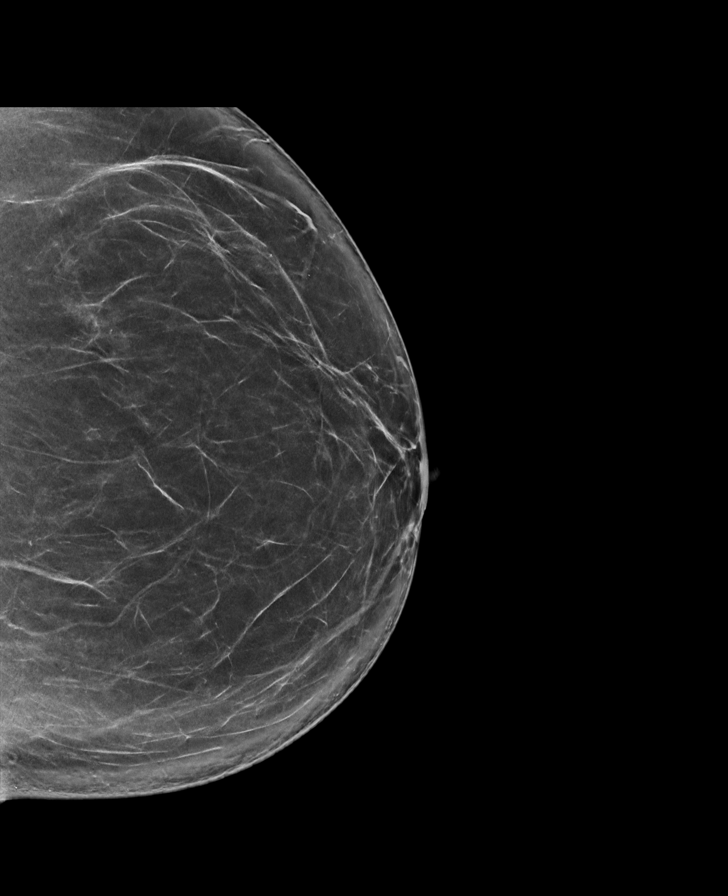

[R MLO synth-2D]
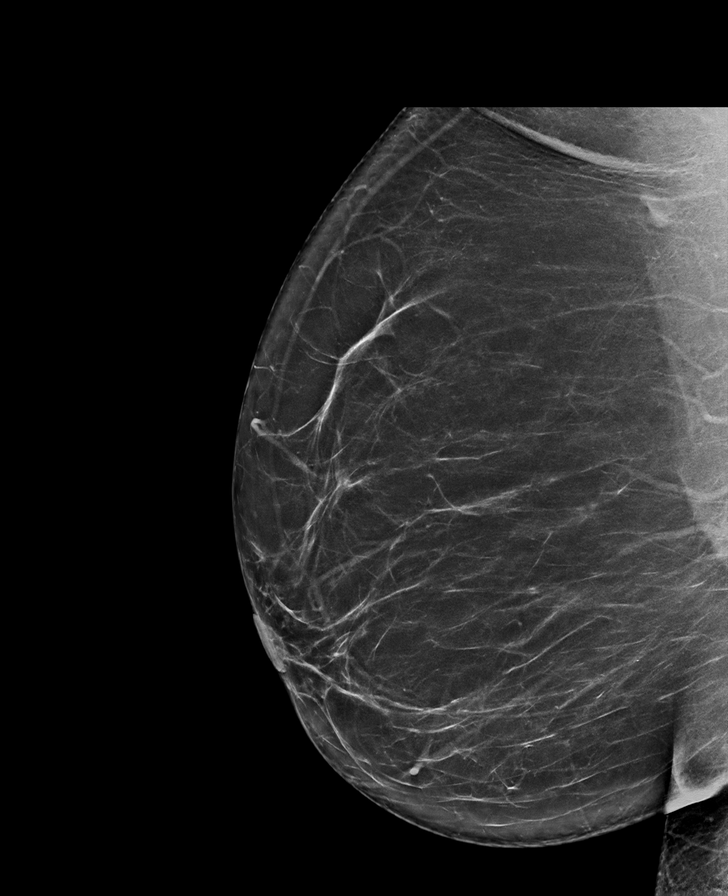

[R CC synth-2D]
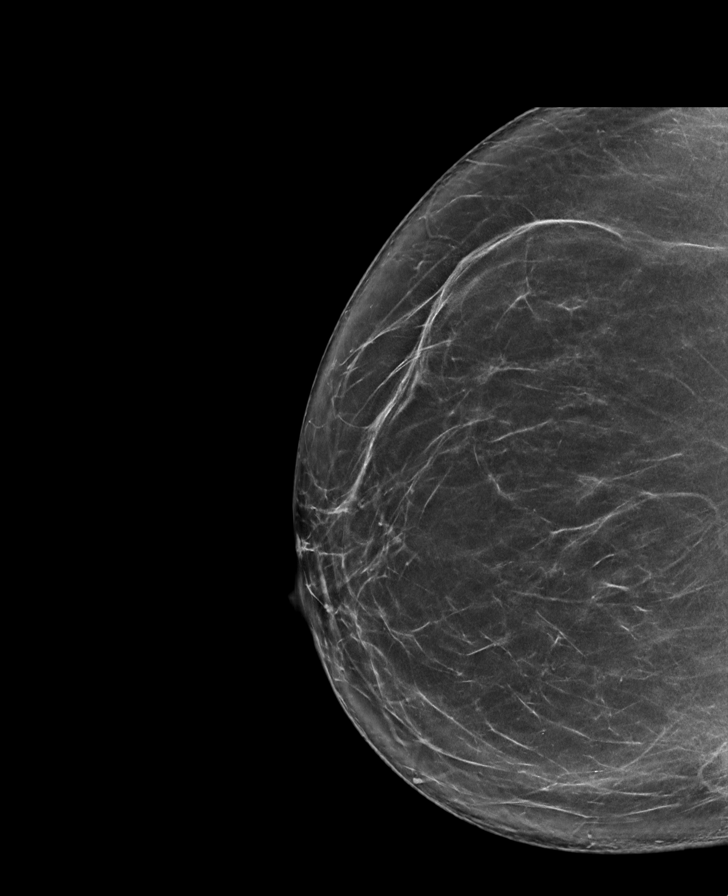

[L MLO synth-2D]
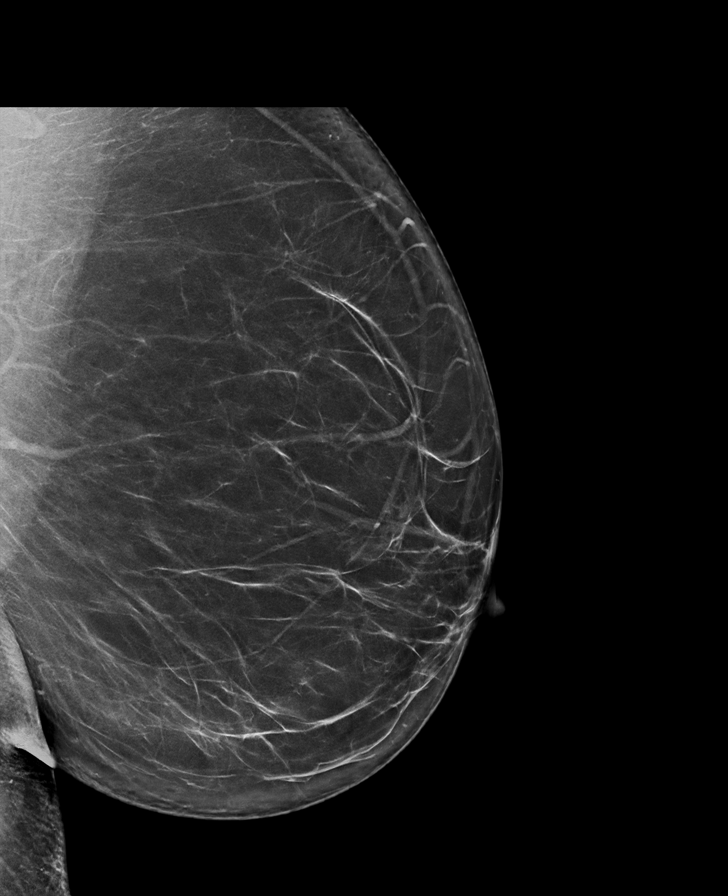

[L MLO tomo · tomo slice 46/91.0]
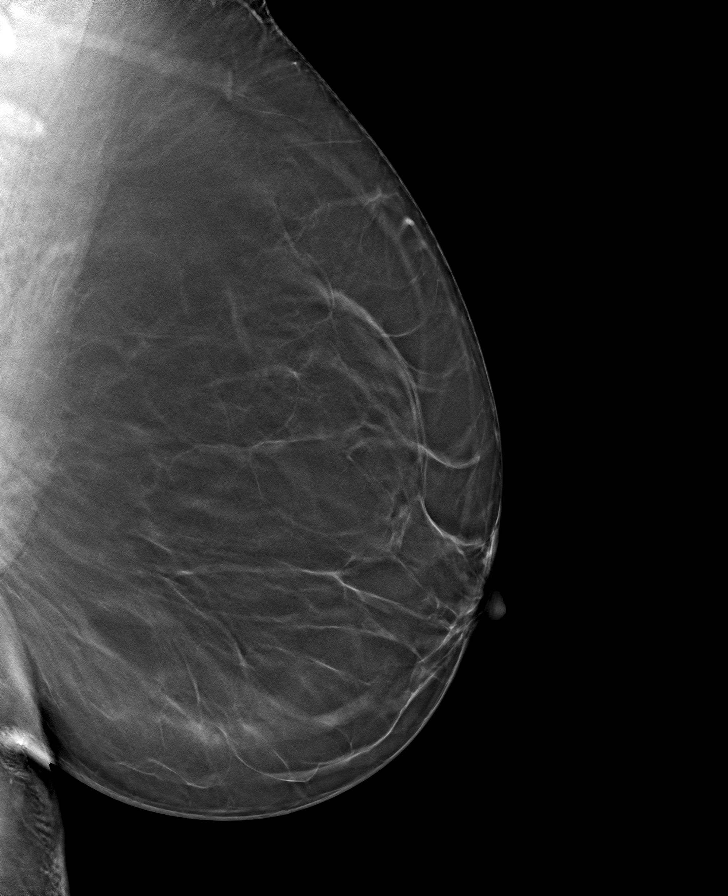

[L CC tomo · tomo slice 45/89.0]
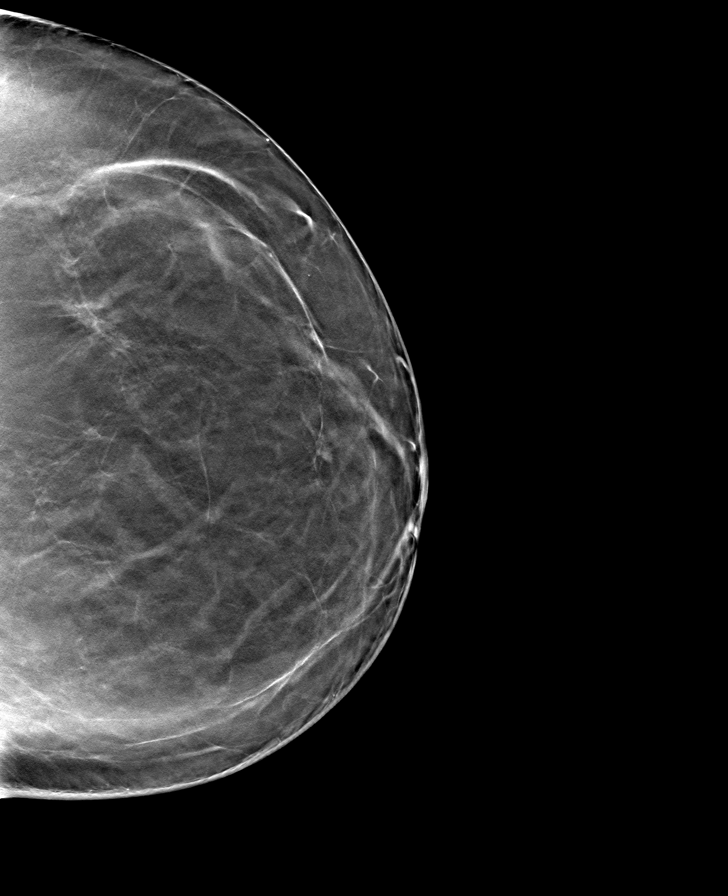

[R CC tomo · tomo slice 45/89.0]
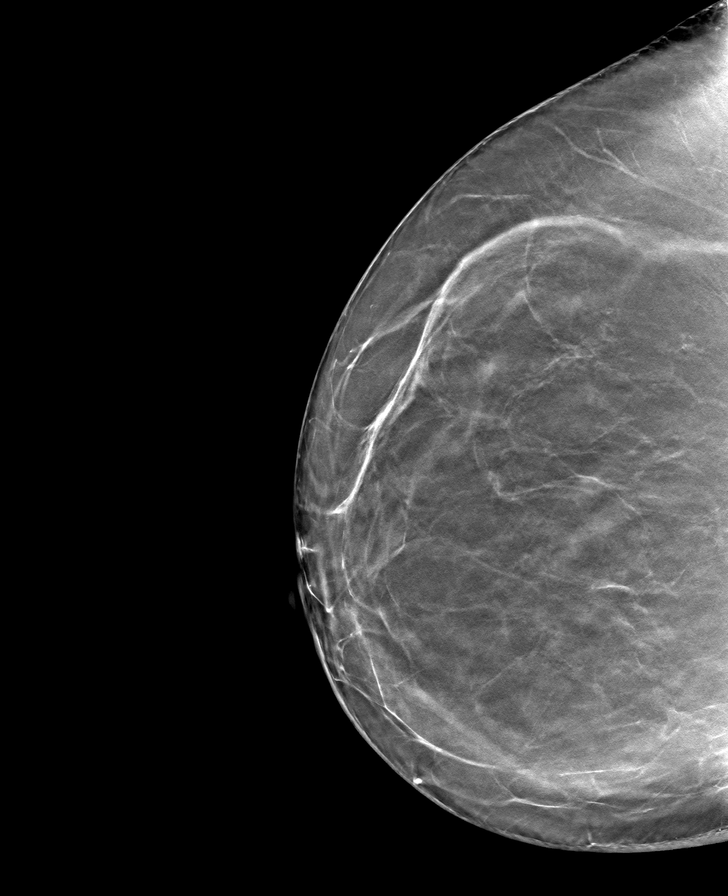

[R MLO tomo · tomo slice 47/92.0]
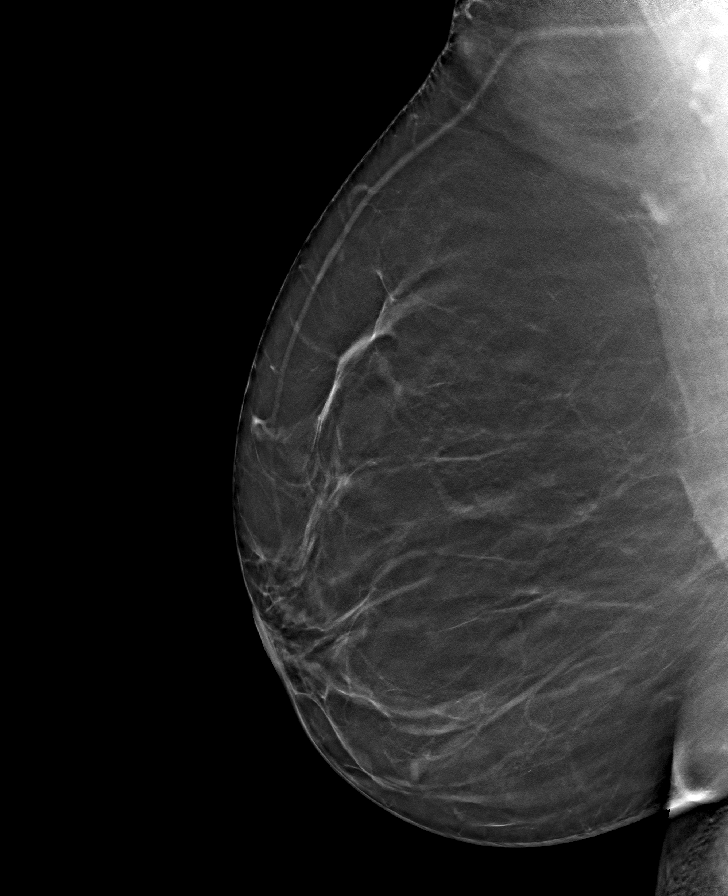

[8 of 24 positions shown; findings below may reference images not displayed]

ACR Breast Density Category b: There are scattered areas of
fibroglandular density.
FINDINGS: There are no findings suspicious for malignancy. Images were
processed with CAD.
IMPRESSION: No mammographic evidence of malignancy. A result letter of this
screening mammogram will be mailed directly to the patient.

RECOMMENDATION:
Screening mammogram in one year. (Code:[id number])

BI-RADS CATEGORY  1: Negative.

## 2022-02-23 ENCOUNTER — Other Ambulatory Visit (HOSPITAL_BASED_OUTPATIENT_CLINIC_OR_DEPARTMENT_OTHER): Payer: Self-pay

## 2022-02-23 MED ORDER — COMIRNATY 30 MCG/0.3ML IM SUSY
PREFILLED_SYRINGE | INTRAMUSCULAR | 0 refills | Status: AC
  Filled 2022-02-23: qty 0.3, 1d supply, fill #0

## 2022-06-09 LAB — AMB RESULTS CONSOLE CBG: Glucose: 92

## 2022-06-09 NOTE — Progress Notes (Signed)
Pt has pcp. No SDOH needs at this time 

## 2022-07-10 ENCOUNTER — Encounter: Payer: Self-pay | Admitting: *Deleted

## 2022-07-10 NOTE — Progress Notes (Signed)
Pt attended 06/09/22 screening event where her repeat b/p was 133/90 and her blood sugar was 92. The pt did not list a PCP name on the event screening form but event RN documented note that "Pt has PCP" . Pt did not identify any SDOH insecurities at the event. Per chart review, pt has been seeing Sherrie Mustache, PA-C at [facility] since 2021. Her most recent [facility] -visible visit with her PCP Sherrie Mustache was on 01/03/22, were her b/p was 130/62. Pt was also seen at her PCP office by another provider on 05/15/22 when her b/p was 144/88.  Pt has upcoming follow -up appt with PCP on 08/01/22. No additional health equity team support is indicated at this time.

## 2022-09-05 ENCOUNTER — Other Ambulatory Visit: Payer: Self-pay

## 2022-09-05 ENCOUNTER — Emergency Department (HOSPITAL_COMMUNITY): Payer: BLUE CROSS/BLUE SHIELD

## 2022-09-05 ENCOUNTER — Emergency Department (HOSPITAL_COMMUNITY)
Admission: EM | Admit: 2022-09-05 | Discharge: 2022-09-05 | Disposition: A | Discharge status: Home / Self Care | Payer: BLUE CROSS/BLUE SHIELD | Attending: Emergency Medicine | Admitting: Emergency Medicine

## 2022-09-05 DIAGNOSIS — R109 Unspecified abdominal pain: Secondary | ICD-10-CM | POA: Diagnosis not present

## 2022-09-05 DIAGNOSIS — M549 Dorsalgia, unspecified: Secondary | ICD-10-CM | POA: Diagnosis not present

## 2022-09-05 DIAGNOSIS — R0781 Pleurodynia: Secondary | ICD-10-CM | POA: Insufficient documentation

## 2022-09-05 DIAGNOSIS — R0602 Shortness of breath: Secondary | ICD-10-CM | POA: Diagnosis not present

## 2022-09-05 DIAGNOSIS — Z79899 Other long term (current) drug therapy: Secondary | ICD-10-CM | POA: Insufficient documentation

## 2022-09-05 LAB — CBC
HCT: 37.5 % (ref 36.0–46.0)
Hemoglobin: 12.2 g/dL (ref 12.0–15.0)
MCH: 29 pg (ref 26.0–34.0)
MCHC: 32.5 g/dL (ref 30.0–36.0)
MCV: 89.3 fL (ref 80.0–100.0)
Platelets: 298 10*3/uL (ref 150–400)
RBC: 4.2 MIL/uL (ref 3.87–5.11)
RDW: 13.7 % (ref 11.5–15.5)
WBC: 4.1 10*3/uL (ref 4.0–10.5)
nRBC: 0 % (ref 0.0–0.2)

## 2022-09-05 LAB — BASIC METABOLIC PANEL
Anion gap: 11 (ref 5–15)
BUN: 13 mg/dL (ref 6–20)
CO2: 25 mmol/L (ref 22–32)
Calcium: 9.5 mg/dL (ref 8.9–10.3)
Chloride: 102 mmol/L (ref 98–111)
Creatinine, Ser: 0.81 mg/dL (ref 0.44–1.00)
GFR, Estimated: >60 mL/min (ref >60)
Glucose, Bld: 109 mg/dL — ABNORMAL HIGH (ref 70–99)
Potassium: 3.2 mmol/L — ABNORMAL LOW (ref 3.5–5.1)
Sodium: 138 mmol/L (ref 135–145)

## 2022-09-05 LAB — LIPASE, BLOOD: Lipase: 28 U/L (ref 11–51)

## 2022-09-05 MED ORDER — SODIUM CHLORIDE 0.9 % IV BOLUS
500.0000 mL | Freq: Once | INTRAVENOUS | Status: AC
  Administered 2022-09-05: 500 mL via INTRAVENOUS

## 2022-09-05 MED ORDER — HYDROCORTISONE 1 % EX CREA: TOPICAL_CREAM | CUTANEOUS | 0 refills | Status: AC

## 2022-09-05 MED ORDER — KETOROLAC TROMETHAMINE 30 MG/ML IJ SOLN
30.0000 mg | Freq: Once | INTRAMUSCULAR | Status: AC
  Administered 2022-09-05: 30 mg via INTRAVENOUS
  Filled 2022-09-05: qty 1

## 2022-09-05 MED ORDER — NAPROXEN 375 MG PO TABS: 375.0000 mg | ORAL_TABLET | Freq: Two times a day (BID) | ORAL | 0 refills | Status: AC

## 2022-09-05 NOTE — ED Provider Notes (Signed)
[place] EMERGENCY DEPARTMENT AT [facility] Provider Note   CSN: [id number] Arrival date & time: 09/05/22  8295     History  Chief Complaint  Patient presents with   Abdominal Pain   Back Pain   Shortness of Breath    Wendy Mann is a 62 y.o. female.  HPI   62 year old female presents emergency department with right-sided rib pain.  She states that she rolled over in bed and had sudden onset right rib pain.  Since then it has been tender to the touch, worse with certain movements.  No overlying skin changes or rash.  Sometimes the pain makes it hard to take a deep breath but she otherwise denies any difficulty breathing, cough, fever, lower extremity swelling.  No history of any cardiac disease.  Pain does not radiate to her back, jaw.  Home Medications Prior to Admission medications   Medication Sig Start Date End Date Taking? Authorizing Provider  acyclovir (ZOVIRAX) 400 MG tablet Take 1 tablet (400 mg total) by mouth 2 (two) times daily. 06/13/18   Shon Hale, MD  acyclovir ointment (ZOVIRAX) 5 % Apply 1 application topically 2 (two) times daily as needed. 01/30/18   Howard Pouch, MD  COVID-19 mRNA vaccine [phone] (COMIRNATY) syringe Inject into the muscle. 02/23/22     [facility] MELATONIN 10 MG SUBL DISSOLVE 1 TABLET UNDER THE TONGUE DAILY 06/13/18   Shon Hale, MD  Elastic Bandages & Supports (WRIST SPLINT LEFT/RIGHT) MISC 2 Units by Does not apply route at bedtime. 05/20/17   Shon Hale, MD  hydrochlorothiazide (HYDRODIURIL) 25 MG tablet TAKE 1 TABLET BY MOUTH EVERY DAY 09/25/18   Melene Plan, MD  hydrOXYzine (ATARAX/VISTARIL) 50 MG tablet TAKE 1 TABLET (50 MG TOTAL) BY MOUTH 3 (THREE) TIMES DAILY AS NEEDED. 12/16/17   Shon Hale, MD  Lysine 500 MG CAPS Take by mouth 2 (two) times daily.    [provider]      Allergies    Penicillins    Review of Systems   Review of Systems  Constitutional:  Negative for  fatigue and fever.  Respiratory:  Positive for shortness of breath. Negative for chest tightness.   Cardiovascular:  Negative for chest pain, palpitations and leg swelling.  Gastrointestinal:  Negative for abdominal pain, diarrhea and vomiting.  Musculoskeletal:  Negative for back pain.       + Right rib pain  Skin:  Negative for rash.  Neurological:  Negative for headaches.    Physical Exam Updated Vital Signs BP (!) 157/93 (BP Location: Left Arm)   Pulse 65   Temp 97.7 F (36.5 C) (Oral)   Resp 18   Ht 5' 7.75" (1.721 m)   Wt 88.5 kg   SpO2 100%   BMI 29.87 kg/m  Physical Exam Vitals and nursing note reviewed.  Constitutional:      Appearance: Normal appearance.  HENT:     Head: Normocephalic.     Mouth/Throat:     Mouth: Mucous membranes are moist.  Cardiovascular:     Rate and Rhythm: Normal rate.  Pulmonary:     Effort: Pulmonary effort is normal. No respiratory distress.     Comments: Reproducible tenderness to palpation of the right lateral ribs, underneath the breast.  No overlying skin changes, no crepitus or deformity Abdominal:     Palpations: Abdomen is soft.     Tenderness: There is no abdominal tenderness.  Skin:    General: Skin is  warm.  Neurological:     Mental Status: She is alert and oriented to person, place, and time. Mental status is at baseline.  Psychiatric:        Mood and Affect: Mood normal.     ED Results / Procedures / Treatments   Labs (all labs ordered are listed, but only abnormal results are displayed) Labs Reviewed  BASIC METABOLIC PANEL - Abnormal; Notable for the following components:      Result Value   Potassium 3.2 (*)    Glucose, Bld 109 (*)    All other components within normal limits  CBC  LIPASE, BLOOD    EKG None  Radiology DG Chest 2 View  Result Date: 09/05/2022 CLINICAL DATA:  Shortness of breath EXAM: CHEST - 2 VIEW COMPARISON:  None Available. FINDINGS: The heart size and mediastinal contours are within  normal limits. No consolidation, pneumothorax or effusion. No edema. The visualized skeletal structures are unremarkable. IMPRESSION: No acute cardiopulmonary disease. Electronically Signed   By: Karen Kays M.D.   On: 09/05/2022 10:37    Procedures Procedures    Medications Ordered in ED Medications  ketorolac (TORADOL) 30 MG/ML injection 30 mg (has no administration in time range)  sodium chloride 0.9 % bolus 500 mL (has no administration in time range)    ED Course/ Medical Decision Making/ A&P                                 Medical Decision Making Amount and/or Complexity of Data Reviewed Labs: ordered. Radiology: ordered.  Risk Prescription drug management.   62 year old female presents emergency department with right-sided rib pain that started when she rolled over in bed.  It is tender to the touch, reproducible on exam, appears physical.  She has a mild shortness of breath when the pain is severe but otherwise denies any chest pain, back pain or acute symptoms.  Vitals are normal and stable on arrival.  EKG has no ischemic changes.  Chest x-ray is unremarkable, no pneumothorax.  Right rib pain is reproducible on exam without any overlying skin changes/rash.  Blood work is otherwise reassuring, abdominal labs are negative.  No abdominal pain.  I believe this pain is musculoskeletal, reproducible on exam.  Very low suspicion for ACS, PE or other acute findings.  Patient at this time appears safe and stable for discharge and close outpatient follow up. Discharge plan and strict return to ED precautions discussed, patient verbalizes understanding and agreement.        Final Clinical Impression(s) / ED Diagnoses Final diagnoses:  None    Rx / DC Orders ED Discharge Orders     None         Rozelle Logan, DO 09/05/22 1515

## 2022-09-05 NOTE — Discharge Instructions (Signed)
You have been seen and discharged from the emergency department.  I believe your rib pain may be musculoskeletal.  Your heart and lung workup were normal.  Take pain medication as directed.  Use topical cream as prescribed.  Follow-up with your primary provider for further evaluation and further care. Take home medications as prescribed. If you have any worsening symptoms or further concerns for your health please return to an emergency department for further evaluation.

## 2022-09-05 NOTE — ED Triage Notes (Signed)
Pt. Stated, I have a sharpe pain that starts on my rt. Side underneath my rt. Breast and goes to my back which makes me be SOB. This started this morning.
